# Patient Record
Sex: Female | Born: 1970
Health system: Southern US, Community
[De-identification: ages and names within clinical notes are randomized; demographics above are authoritative.]

## PROBLEM LIST (undated history)

## (undated) DIAGNOSIS — I10 Essential (primary) hypertension: Secondary | ICD-10-CM

## (undated) DIAGNOSIS — F32A Depression, unspecified: Secondary | ICD-10-CM

## (undated) DIAGNOSIS — K219 Gastro-esophageal reflux disease without esophagitis: Secondary | ICD-10-CM

## (undated) DIAGNOSIS — E119 Type 2 diabetes mellitus without complications: Secondary | ICD-10-CM

## (undated) DIAGNOSIS — F329 Major depressive disorder, single episode, unspecified: Secondary | ICD-10-CM

## (undated) HISTORY — DX: Porphyria cutanea tarda: E80.1

## (undated) HISTORY — PX: OOPHORECTOMY: SHX86

## (undated) HISTORY — PX: TOTAL ABDOMINAL HYSTERECTOMY: SHX209

## (undated) HISTORY — DX: Gastro-esophageal reflux disease without esophagitis: K21.9

## (undated) HISTORY — PX: ABDOMINAL HYSTERECTOMY: SHX81

---

## 2007-02-17 ENCOUNTER — Emergency Department: Payer: Self-pay | Admitting: Emergency Medicine

## 2010-10-26 ENCOUNTER — Ambulatory Visit: Payer: Self-pay

## 2012-04-26 ENCOUNTER — Ambulatory Visit (INDEPENDENT_AMBULATORY_CARE_PROVIDER_SITE_OTHER): Payer: Self-pay | Admitting: Physician Assistant

## 2012-04-26 VITALS — BP 146/90 | HR 66 | Temp 98.8°F | Resp 16 | Ht 62.0 in | Wt 158.0 lb

## 2012-04-26 DIAGNOSIS — Z111 Encounter for screening for respiratory tuberculosis: Secondary | ICD-10-CM

## 2012-04-26 NOTE — Progress Notes (Signed)
  Subjective:    Patient ID: Helen Martinez, female    DOB: [birth year], 41 y.o.   MRN: [id number]  HPI 41 year old female presents for TB test. 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 She has had TB test's in the past and never had a positive test.      Review of Systems  All other systems reviewed and are negative.       Objective:   Physical Exam  Constitutional: She is oriented to person, place, and time. She appears well-developed and well-nourished.  HENT:  Head: Normocephalic and atraumatic.  Right Ear: External ear normal.  Left Ear: External ear normal.  Eyes: Conjunctivae are normal.  Neck: Normal range of motion.  Cardiovascular: Normal rate, regular rhythm and normal heart sounds.   Pulmonary/Chest: Effort normal and breath sounds normal.  Neurological: She is alert and oriented to person, place, and time.  Psychiatric: She has a normal mood and affect. Her behavior is normal. Judgment and thought content normal.          Assessment & Plan:   1. Screening for tuberculosis  TB Skin Test  Return in 48-72 hours for TB read

## 2012-04-28 ENCOUNTER — Encounter (INDEPENDENT_AMBULATORY_CARE_PROVIDER_SITE_OTHER): Payer: Self-pay

## 2012-04-28 DIAGNOSIS — Z111 Encounter for screening for respiratory tuberculosis: Secondary | ICD-10-CM

## 2012-04-28 LAB — TB SKIN TEST
Induration: 0 mm
TB Skin Test: NEGATIVE

## 2013-09-16 ENCOUNTER — Emergency Department (HOSPITAL_COMMUNITY): Payer: Self-pay

## 2013-09-16 ENCOUNTER — Emergency Department (HOSPITAL_COMMUNITY)
Admission: EM | Admit: 2013-09-16 | Discharge: 2013-09-16 | Disposition: A | Discharge status: Home / Self Care | Payer: Self-pay | Attending: Emergency Medicine | Admitting: Emergency Medicine

## 2013-09-16 ENCOUNTER — Encounter (HOSPITAL_COMMUNITY): Payer: Self-pay | Admitting: Emergency Medicine

## 2013-09-16 DIAGNOSIS — Y939 Activity, unspecified: Secondary | ICD-10-CM | POA: Insufficient documentation

## 2013-09-16 DIAGNOSIS — W010XXA Fall on same level from slipping, tripping and stumbling without subsequent striking against object, initial encounter: Secondary | ICD-10-CM | POA: Insufficient documentation

## 2013-09-16 DIAGNOSIS — F329 Major depressive disorder, single episode, unspecified: Secondary | ICD-10-CM | POA: Insufficient documentation

## 2013-09-16 DIAGNOSIS — F17200 Nicotine dependence, unspecified, uncomplicated: Secondary | ICD-10-CM | POA: Insufficient documentation

## 2013-09-16 DIAGNOSIS — F3289 Other specified depressive episodes: Secondary | ICD-10-CM | POA: Insufficient documentation

## 2013-09-16 DIAGNOSIS — R102 Pelvic and perineal pain: Secondary | ICD-10-CM

## 2013-09-16 DIAGNOSIS — M549 Dorsalgia, unspecified: Secondary | ICD-10-CM

## 2013-09-16 DIAGNOSIS — IMO0002 Reserved for concepts with insufficient information to code with codable children: Secondary | ICD-10-CM | POA: Insufficient documentation

## 2013-09-16 DIAGNOSIS — Z79899 Other long term (current) drug therapy: Secondary | ICD-10-CM | POA: Insufficient documentation

## 2013-09-16 DIAGNOSIS — Y929 Unspecified place or not applicable: Secondary | ICD-10-CM | POA: Insufficient documentation

## 2013-09-16 DIAGNOSIS — I10 Essential (primary) hypertension: Secondary | ICD-10-CM | POA: Insufficient documentation

## 2013-09-16 HISTORY — DX: Essential (primary) hypertension: I10

## 2013-09-16 HISTORY — DX: Major depressive disorder, single episode, unspecified: F32.9

## 2013-09-16 HISTORY — DX: Depression, unspecified: F32.A

## 2013-09-16 MED ORDER — ACETAMINOPHEN 325 MG PO TABS
650.0000 mg | ORAL_TABLET | Freq: Once | ORAL | Status: AC
  Administered 2013-09-16: 650 mg via ORAL
  Filled 2013-09-16: qty 2

## 2013-09-16 MED ORDER — OXYCODONE-ACETAMINOPHEN 5-325 MG PO TABS: 1.0000 | ORAL_TABLET | ORAL | Status: DC | PRN

## 2013-09-16 MED ORDER — IBUPROFEN 600 MG PO TABS: 600.0000 mg | ORAL_TABLET | Freq: Three times a day (TID) | ORAL | Status: DC | PRN

## 2013-09-16 NOTE — ED Notes (Signed)
Patient reports that she slipped on her back porch steps and slid flat on her back. Patient c/o right lower back pain that has not gotten any better. MAE. Patient denies any numbness or tingling of all extremities.

## 2013-09-16 NOTE — ED Notes (Signed)
Patient transported to X-ray 

## 2013-09-16 NOTE — ED Provider Notes (Signed)
CSN: [id number]     Arrival date & time 09/16/13  0813 History   First MD Initiated Contact with Patient 09/16/13 0820     Chief Complaint  Patient presents with  . Back Pain  . Fall    HPI Patient reports slipping and falling on her bottom 3 days ago.  She slipped on a wet porch.  She denies weakness of her lower extremities.  She reports pain in her right SI joint as well as her right lumbar spine.  She does have a small bruise overlying this area.  She denies pain with range of motion of her right hip.  She tried ibuprofen without improvement in her pain.  Her pain is mild to moderate in severity worse with movement and palpation.   Past Medical History  Diagnosis Date  . Hypertension   . Depression    Past Surgical History  Procedure Laterality Date  . Abdominal hysterectomy     Family History  Problem Relation Age of Onset  . Hypertension Mother   . Cancer Father    History  Substance Use Topics  . Smoking status: Current Every Day Smoker -- 1.00 packs/day    Types: Cigarettes  . Smokeless tobacco: Never Used  . Alcohol Use: Yes     Comment: wine 2-3 times a week   OB History   Grav Para Term Preterm Abortions TAB SAB Ect Mult Living                 Review of Systems  All other systems reviewed and are negative.    Allergies  Review of patient's allergies indicates no known allergies.  Home Medications   Current Outpatient Rx  Name  Route  Sig  Dispense  Refill  . ibuprofen (ADVIL,MOTRIN) 200 MG tablet   Oral   Take 800 mg by mouth every 6 (six) hours as needed for mild pain or moderate pain.         Marland Kitchen. venlafaxine XR (EFFEXOR-XR) 150 MG 24 hr capsule   Oral   Take 150 mg by mouth daily with breakfast.         . ibuprofen (ADVIL,MOTRIN) 600 MG tablet   Oral   Take 1 tablet (600 mg total) by mouth every 8 (eight) hours as needed.   15 tablet   0   . oxyCODONE-acetaminophen (PERCOCET/ROXICET) 5-325 MG per tablet   Oral   Take 1 tablet by mouth  every 4 (four) hours as needed for severe pain.   20 tablet   0    BP 160/88  Pulse 66  Temp(Src) 97.9 F (36.6 C) (Oral)  Resp 18  Ht 5' 2.5" (1.588 m)  Wt 162 lb (73.483 kg)  BMI 29.14 kg/m2  SpO2 98% Physical Exam  Nursing note and vitals reviewed. Constitutional: She is oriented to person, place, and time. She appears well-developed and well-nourished. No distress.  HENT:  Head: Normocephalic and atraumatic.  Eyes: EOM are normal.  Neck: Normal range of motion.  Cardiovascular: Normal rate, regular rhythm and normal heart sounds.   Pulmonary/Chest: Effort normal and breath sounds normal.  Abdominal: Soft. She exhibits no distension. There is no tenderness.  Musculoskeletal: Normal range of motion.  Mild tenderness of her lumbar spine without lumbar step-off.  Mild tenderness of her right SI joint without significant deformity.  Small amount of bruising noted of her low back.  5 out of 5 strength in bilateral lower extremity major muscle groups  Neurological: She is alert  and oriented to person, place, and time.  Skin: Skin is warm and dry.  Psychiatric: She has a normal mood and affect. Judgment normal.    ED Course  Procedures (including critical care time) Labs Review Labs Reviewed - No data to display Imaging Review Dg Lumbar Spine Complete  09/16/2013   CLINICAL DATA:  Pain status post fall.  EXAM: LUMBAR SPINE - COMPLETE 4+ VIEW  COMPARISON:  AP pelvis dated September 16, 2013.  FINDINGS: The lumbar vertebral bodies are preserved in height. The intervertebral disc space heights are reasonably well maintained. There is no pars defect nor spondylolisthesis. The pedicles and transverse processes appear intact where visualized. The observed portions of the sacrum appear normal.  IMPRESSION: There is no acute bony abnormality or significant degenerative change of the lumbar spine.   Electronically Signed   By: David  [place]   On: 09/16/2013 09:01   Dg Pelvis 1-2  Views  09/16/2013   CLINICAL DATA:  Patient fell.  Right posterior ilium pain.  EXAM: PELVIS - 1-2 VIEW  COMPARISON:  None.  FINDINGS: There is no evidence of pelvic fracture or diastasis. No other pelvic bone lesions are seen.  IMPRESSION: Negative.   Electronically Signed   By: Elige Ko   On: 09/16/2013 08:59    EKG Interpretation   None       MDM   1. Pelvic pain   2. Back pain    Likely musculoskeletal pain.  Normal neurologic function.  X-rays without acute abnormality.  Home with a short course pain medicine    Lyanne Co, MD 09/16/13 [phone]

## 2013-09-18 ENCOUNTER — Encounter: Payer: Self-pay | Admitting: Family Medicine

## 2013-09-18 ENCOUNTER — Ambulatory Visit (INDEPENDENT_AMBULATORY_CARE_PROVIDER_SITE_OTHER): Payer: Self-pay | Admitting: Family Medicine

## 2013-09-18 VITALS — BP 120/68 | HR 77 | Temp 98.1°F | Resp 16 | Wt 163.8 lb

## 2013-09-18 DIAGNOSIS — M549 Dorsalgia, unspecified: Secondary | ICD-10-CM

## 2013-09-18 DIAGNOSIS — S20229A Contusion of unspecified back wall of thorax, initial encounter: Secondary | ICD-10-CM

## 2013-09-18 DIAGNOSIS — S300XXA Contusion of lower back and pelvis, initial encounter: Secondary | ICD-10-CM | POA: Insufficient documentation

## 2013-09-18 MED ORDER — TRAMADOL HCL 50 MG PO TABS: 50.0000 mg | ORAL_TABLET | Freq: Every evening | ORAL | Status: DC | PRN

## 2013-09-18 MED ORDER — METHYLPREDNISOLONE ACETATE 80 MG/ML IJ SUSP
80.0000 mg | Freq: Once | INTRAMUSCULAR | Status: AC
  Administered 2013-09-18: 80 mg via INTRAMUSCULAR

## 2013-09-18 MED ORDER — MELOXICAM 15 MG PO TABS: 15.0000 mg | ORAL_TABLET | Freq: Every day | ORAL | Status: DC

## 2013-09-18 MED ORDER — KETOROLAC TROMETHAMINE 60 MG/2ML IM SOLN
60.0000 mg | Freq: Once | INTRAMUSCULAR | Status: AC
  Administered 2013-09-18: 60 mg via INTRAMUSCULAR

## 2013-09-18 NOTE — Progress Notes (Signed)
   CC: Back pain after fall  HPI: Patient is a very pleasant 43 year old female coming in with back pain after fall. This occurred 3 days ago. Patient fell slipping on ice on her porch falling directly on her back. Patient states over the course of time and seems to be worsening. Patient had a very difficult time at work today secondary to the amount of pain in the lower back mostly on the right side. Patient denies any radiation or any weakness in the lower extremities. Patient states that the pain is unrelenting. Patient originally went to the emergency department where x-rays were done. I did review these x-rays today did not show any bony abnormality or any acute fracture of the lumbar spine or pelvis. Patient has been taking hydrocodone that she states only makes her tired. Patient describes the pain as an unrelenting chronic constant pain that can be worse with movement. States that it does feel like a burning sensation when she moves her leg certain ways. Patient was the severity of 9/10   Past medical, surgical, family and social history reviewed. Medications reviewed all in the electronic medical record.   Review of Systems: No headache, visual changes, nausea, vomiting, diarrhea, constipation, dizziness, abdominal pain, skin rash, fevers, chills, night sweats, weight loss, swollen lymph nodes, body aches, joint swelling, muscle aches, chest pain, shortness of breath, mood changes.   Objective:    Blood pressure 120/68, pulse 77, temperature 98.1 F (36.7 C), temperature source Oral, resp. rate 16, weight 163 lb 12.8 oz (74.299 kg), SpO2 96.00%.   General: No apparent distress alert and oriented x3 mood and affect normal, dressed appropriately.  HEENT: Pupils equal, extraocular movements intact Respiratory: Patient's speak in full sentences and does not appear short of breath Cardiovascular: No lower extremity edema, non tender, no erythema Skin: Warm dry intact with no signs of  infection or rash on extremities or on axial skeleton. Abdomen: Soft nontender Neuro: Cranial nerves II through XII are intact, neurovascularly intact in all extremities with 2+ DTRs and 2+ pulses. Lymph: No lymphadenopathy of posterior or anterior cervical chain or axillae bilaterally.  Gait normal with good balance and coordination.  MSK: Non tender with full range of motion and good stability and symmetric strength and tone of shoulders, elbows, wrist, hip, knee and ankles bilaterally.  Back Exam:  Inspection: Patient does have bruising of the paraspinal musculature on the right side as well as midline. Patient though does not have spinous process tenderness to palpation. Motion: Flexion 25 deg, Extension 35 deg, Side Bending to 35 deg bilaterally,  Rotation to 25 deg bilaterally  SLR laying: Negative  XSLR laying: Positive  Palpable tenderness: As stated above mostly of the right-sided paraspinal musculature no spinous process tenderness. FABER: Unable to do secondary to pain. Sensory change: Gross sensation intact to all lumbar and sacral dermatomes.  Reflexes: 2+ at both patellar tendons, 2+ at achilles tendons, Babinski's downgoing.  Strength at foot  Plantar-flexion: 5/5 Dorsi-flexion: 5/5 Eversion: 5/5 Inversion: 5/5  Leg strength  Quad: 5/5 Hamstring: 5/5 Hip flexor: 5/5 Hip abductors: 5/5  Gait unremarkable.    Impression and Recommendations:     This case required medical decision making of moderate complexity.

## 2013-09-18 NOTE — Patient Instructions (Signed)
Good to meet you Two injections today they will help Ice 20 minutes 3 times a day No lifting at work until I see you Monday Meloxicam daily for 10 days then as needed.  Tramamdol at night Come back Monday.

## 2013-09-18 NOTE — Assessment & Plan Note (Addendum)
I do think patient does have a lower back contusion with a very large muscle contusion. Discuss patient about different treatment options. Patient was given an injection of Toradol as well as Depo-Medrol today. Patient will do meloxicam daily for 10 days and as needed. Discussed icing protocol and starting range of motion exercises in 48 hours. Would like patient to followup again in 5 days for further evaluation. Patient was given a note for work to limited to 20 pound lifting until I see her again on Monday. Patient is to have any radiation down her legs or any new symptoms she will call back immediately. There is a chance that this could be herniated disc am hoping that this only a contusion.

## 2013-09-23 ENCOUNTER — Encounter: Payer: Self-pay | Admitting: *Deleted

## 2013-09-23 ENCOUNTER — Ambulatory Visit (INDEPENDENT_AMBULATORY_CARE_PROVIDER_SITE_OTHER): Payer: Self-pay | Admitting: Family Medicine

## 2013-09-23 ENCOUNTER — Encounter: Payer: Self-pay | Admitting: Family Medicine

## 2013-09-23 VITALS — BP 132/72 | HR 98 | Temp 98.2°F | Resp 16 | Wt 164.8 lb

## 2013-09-23 DIAGNOSIS — S20229A Contusion of unspecified back wall of thorax, initial encounter: Secondary | ICD-10-CM

## 2013-09-23 DIAGNOSIS — S300XXA Contusion of lower back and pelvis, initial encounter: Secondary | ICD-10-CM

## 2013-09-23 NOTE — Progress Notes (Signed)
   CC: Back pain after fall follow up./   HPI: Patient is a very pleasant 43 year old female coming in with back pain after fall. Patient was seen previously and was diagnosed with a bad contusion. Patient was given anti-inflammatories, muscle relaxants, given home exercise program to start of the weekend. Patient states   Past medical, surgical, family and social history reviewed. Medications reviewed all in the electronic medical record.   Review of Systems: No headache, visual changes, nausea, vomiting, diarrhea, constipation, dizziness, abdominal pain, skin rash, fevers, chills, night sweats, weight loss, swollen lymph nodes, body aches, joint swelling, muscle aches, chest pain, shortness of breath, mood changes.   Objective:    There were no vitals taken for this visit.   General: No apparent distress alert and oriented x3 mood and affect normal, dressed appropriately.  HEENT: Pupils equal, extraocular movements intact Respiratory: Patient's speak in full sentences and does not appear short of breath Cardiovascular: No lower extremity edema, non tender, no erythema Skin: Warm dry intact with no signs of infection or rash on extremities or on axial skeleton. Abdomen: Soft nontender Neuro: Cranial nerves II through XII are intact, neurovascularly intact in all extremities with 2+ DTRs and 2+ pulses. Lymph: No lymphadenopathy of posterior or anterior cervical chain or axillae bilaterally.  Gait normal with good balance and coordination.  MSK: Non tender with full range of motion and good stability and symmetric strength and tone of shoulders, elbows, wrist, hip, knee and ankles bilaterally.  Back Exam:  Inspection: Patient's bruising has improved since previous visit. Patient though does not have spinous process tenderness to palpation. Motion: Flexion 35 deg, Extension 35 deg, Side Bending to 45 deg bilaterally,  Rotation to 35 deg bilaterally  SLR laying: Negative  XSLR laying:  Negative  Palpable tenderness: As stated above mostly of the right-sided paraspinal musculature no spinous process tenderness. FABER: Unable to do secondary to pain. Sensory change: Gross sensation intact to all lumbar and sacral dermatomes.  Reflexes: 2+ at both patellar tendons, 2+ at achilles tendons, Babinski's downgoing.  Strength at foot  Plantar-flexion: 5/5 Dorsi-flexion: 5/5 Eversion: 5/5 Inversion: 5/5  Leg strength  Quad: 5/5 Hamstring: 5/5 Hip flexor: 5/5 Hip abductors: 5/5  Gait unremarkable.    Impression and Recommendations:     This case required medical decision making of moderate complexity.

## 2013-09-23 NOTE — Assessment & Plan Note (Signed)
Patient is continuing to improve and I do not find any radicular symptoms not scared for herniated disc. Did not feel x-rays are necessary at this time. Patient's is able to lift all restrictions from work at this time. Encourage her to continue her exercises as well as ibuprofen for the next 5 days. Patient should follow up in 2 weeks we'll more time for further evaluation or patient can call to make sure she continues to do well.

## 2013-09-23 NOTE — Progress Notes (Signed)
Pre-visit discussion using our clinic review tool. No additional management support is needed unless otherwise documented below in the visit note.  

## 2013-09-23 NOTE — Patient Instructions (Signed)
Good to see you Exercises 3 times a week Ibuprofen for next 5 days scheduled.  No restriction at work.  See you again in 2 weeks to make sure you are completely fine.

## 2014-12-16 ENCOUNTER — Ambulatory Visit: Admit: 2014-12-16 | Payer: Self-pay | Admitting: Oncology

## 2015-01-01 ENCOUNTER — Ambulatory Visit
Admit: 2015-01-01 | Disposition: A | Discharge status: Home / Self Care | Payer: Self-pay | Attending: Family Medicine | Admitting: Family Medicine

## 2015-01-27 ENCOUNTER — Other Ambulatory Visit: Payer: Self-pay | Admitting: Internal Medicine

## 2015-01-27 DIAGNOSIS — N63 Unspecified lump in unspecified breast: Secondary | ICD-10-CM

## 2015-01-27 DIAGNOSIS — R928 Other abnormal and inconclusive findings on diagnostic imaging of breast: Secondary | ICD-10-CM

## 2015-02-04 ENCOUNTER — Ambulatory Visit
Admission: RE | Admit: 2015-02-04 | Discharge: 2015-02-04 | Disposition: A | Discharge status: Home / Self Care | Payer: 59 | Source: Ambulatory Visit | Attending: Internal Medicine | Admitting: Internal Medicine

## 2015-02-04 DIAGNOSIS — R928 Other abnormal and inconclusive findings on diagnostic imaging of breast: Secondary | ICD-10-CM

## 2015-02-04 DIAGNOSIS — R922 Inconclusive mammogram: Secondary | ICD-10-CM | POA: Diagnosis not present

## 2015-02-04 DIAGNOSIS — N63 Unspecified lump in unspecified breast: Secondary | ICD-10-CM

## 2015-09-23 DIAGNOSIS — J40 Bronchitis, not specified as acute or chronic: Secondary | ICD-10-CM | POA: Diagnosis not present

## 2015-09-23 DIAGNOSIS — I10 Essential (primary) hypertension: Secondary | ICD-10-CM | POA: Insufficient documentation

## 2015-09-23 DIAGNOSIS — J039 Acute tonsillitis, unspecified: Secondary | ICD-10-CM | POA: Diagnosis not present

## 2015-09-23 DIAGNOSIS — E1159 Type 2 diabetes mellitus with other circulatory complications: Secondary | ICD-10-CM | POA: Insufficient documentation

## 2015-09-24 MED FILL — VENTOLIN HFA 90 MCG INHALER: 108 (90 BAS | 17 days supply | Qty: 18 | Fill #0

## 2015-09-24 MED FILL — AMOX TR-K CLV 875-125 MG TA: 875-125 | 10 days supply | Qty: 20 | Fill #0

## 2015-09-24 MED FILL — PROMETHAZINE-DM SYRUP: 6.25-15 | 7 days supply | Qty: 120 | Fill #0

## 2015-09-24 MED FILL — predniSONE 20 MG TABS: 20 | 6 days supply | Qty: 18 | Fill #0

## 2015-10-06 DIAGNOSIS — I10 Essential (primary) hypertension: Secondary | ICD-10-CM | POA: Diagnosis not present

## 2015-10-06 DIAGNOSIS — R232 Flushing: Secondary | ICD-10-CM | POA: Diagnosis not present

## 2015-11-16 MED FILL — VENLAFAXINE HCL ER 150 MG C: 150 | 90 days supply | Qty: 90 | Fill #0

## 2015-11-17 MED FILL — HYDROCHLOROTHIAZIDE 25 MG T: 25 | 30 days supply | Qty: 30 | Fill #0

## 2015-11-17 MED FILL — LOSARTAN POTASSIUM 50 MG TA: 50 | 30 days supply | Qty: 30 | Fill #0

## 2015-12-15 DIAGNOSIS — R778 Other specified abnormalities of plasma proteins: Secondary | ICD-10-CM | POA: Diagnosis not present

## 2015-12-15 DIAGNOSIS — I10 Essential (primary) hypertension: Secondary | ICD-10-CM | POA: Diagnosis not present

## 2015-12-21 MED FILL — HYDROCHLOROTHIAZIDE 25 MG T: 25 | 30 days supply | Qty: 30 | Fill #0

## 2015-12-21 MED FILL — LOSARTAN POTASSIUM 50 MG TA: 50 | 30 days supply | Qty: 30 | Fill #1

## 2015-12-23 MED FILL — AMLODIPINE BESYLATE 5 MG TA: 5 | 90 days supply | Qty: 90 | Fill #0

## 2016-01-20 ENCOUNTER — Other Ambulatory Visit: Payer: Self-pay | Admitting: Family Medicine

## 2016-01-20 DIAGNOSIS — Z1231 Encounter for screening mammogram for malignant neoplasm of breast: Secondary | ICD-10-CM

## 2016-01-22 MED FILL — HYDROCHLOROTHIAZIDE 25 MG T: 25 | 30 days supply | Qty: 30 | Fill #1

## 2016-01-22 MED FILL — LOSARTAN POTASSIUM 50 MG TA: 50 | 30 days supply | Qty: 30 | Fill #2

## 2016-02-09 ENCOUNTER — Other Ambulatory Visit: Payer: Self-pay | Admitting: Family Medicine

## 2016-02-09 ENCOUNTER — Ambulatory Visit
Admission: RE | Admit: 2016-02-09 | Discharge: 2016-02-09 | Disposition: A | Discharge status: Home / Self Care | Payer: 59 | Source: Ambulatory Visit | Attending: Family Medicine | Admitting: Family Medicine

## 2016-02-09 DIAGNOSIS — Z1231 Encounter for screening mammogram for malignant neoplasm of breast: Secondary | ICD-10-CM | POA: Diagnosis not present

## 2016-02-10 ENCOUNTER — Ambulatory Visit: Payer: 59

## 2016-02-18 MED FILL — HYDROCHLOROTHIAZIDE 25 MG T: 25 | 30 days supply | Qty: 30 | Fill #2

## 2016-02-22 MED FILL — VENLAFAXINE HCL ER 150 MG C: 150 | 90 days supply | Qty: 90 | Fill #0

## 2016-02-22 MED FILL — LOSARTAN POTASSIUM 50 MG TA: 50 | 90 days supply | Qty: 90 | Fill #0

## 2016-03-16 MED FILL — AMLODIPINE BESYLATE 5 MG TA: 5 | 30 days supply | Qty: 30 | Fill #0

## 2016-04-20 MED FILL — AMLODIPINE BESYLATE 5 MG TA: 5 | 30 days supply | Qty: 30 | Fill #1

## 2016-05-13 MED FILL — VENLAFAXINE HCL ER 150 MG C: 150 | 90 days supply | Qty: 90 | Fill #1

## 2016-05-13 MED FILL — AMLODIPINE BESYLATE 5 MG TA: 5 | 30 days supply | Qty: 30 | Fill #2

## 2016-05-16 MED FILL — LOSARTAN POTASSIUM 50 MG TA: 50 | 90 days supply | Qty: 90 | Fill #0

## 2016-05-18 ENCOUNTER — Encounter (INDEPENDENT_AMBULATORY_CARE_PROVIDER_SITE_OTHER): Payer: Self-pay

## 2016-05-18 ENCOUNTER — Ambulatory Visit (INDEPENDENT_AMBULATORY_CARE_PROVIDER_SITE_OTHER): Payer: 59 | Admitting: Gastroenterology

## 2016-05-18 ENCOUNTER — Encounter: Payer: Self-pay | Admitting: Gastroenterology

## 2016-05-18 VITALS — BP 120/76 | HR 88 | Ht 62.5 in | Wt 171.5 lb

## 2016-05-18 DIAGNOSIS — R195 Other fecal abnormalities: Secondary | ICD-10-CM

## 2016-05-18 DIAGNOSIS — K6289 Other specified diseases of anus and rectum: Secondary | ICD-10-CM

## 2016-05-18 DIAGNOSIS — K602 Anal fissure, unspecified: Secondary | ICD-10-CM

## 2016-05-18 MED ORDER — DILTIAZEM GEL 2 %: 1.0000 "application " | Freq: Three times a day (TID) | CUTANEOUS | 1 refills | Status: DC

## 2016-05-18 MED FILL — HYDROCHLOROTHIAZIDE 25 MG T: 25 | 90 days supply | Qty: 90 | Fill #0

## 2016-05-18 NOTE — Patient Instructions (Signed)
We sent a [facility] for the Diltiazem gel 2 %.  We have given you samples of Metmucil fiber powder.   Call us back with an update in 2-3 weeks. You can ask for Kyro Joswick.  We have given you rectal care instructions.

## 2016-06-02 ENCOUNTER — Encounter: Payer: Self-pay | Admitting: Gastroenterology

## 2016-06-02 DIAGNOSIS — K6289 Other specified diseases of anus and rectum: Secondary | ICD-10-CM | POA: Insufficient documentation

## 2016-06-02 DIAGNOSIS — R195 Other fecal abnormalities: Secondary | ICD-10-CM | POA: Insufficient documentation

## 2016-06-02 DIAGNOSIS — K602 Anal fissure, unspecified: Secondary | ICD-10-CM | POA: Insufficient documentation

## 2016-06-02 NOTE — Progress Notes (Signed)
05/18/2016 Helen Martinez [id number] 4/09/811903/11/1970   HISTORY OF PRESENT ILLNESS:  This is a 45 year old female who is new to our practice.  She presents to our office today for complaints of rectal pain and bleeding recently that she thinks is secondary to hemorrhoids.  Says that it hurts to have a BM, etc on and off for the past month.  Sees small amounts of BRB on the TP at times recently as well.  Has tried generic Preparation H OTC without relief.  Says that she tends to have loose stools for years and always thought she had IBS.  Has had some diarrhea recently and contributed that to the IBS as well.  Also mentions that she has a hard time keeping herself clean and that she sometimes notices seepage of stool contents.  Past Medical History:  Diagnosis Date  . Depression   . GERD (gastroesophageal reflux disease)   . Hypertension    Past Surgical History:  Procedure Laterality Date  . ABDOMINAL HYSTERECTOMY      reports that she has been smoking Cigarettes.  She has been smoking about 1.00 pack per day. She has never used smokeless tobacco. She reports that she drinks alcohol. She reports that she does not use drugs. family history includes Breast cancer in her maternal aunt; Cancer in her father; Hypertension in her mother. No Known Allergies    Outpatient Encounter Prescriptions as of 05/18/2016  Medication Sig  . amLODipine (NORVASC) 10 MG tablet Take 10 mg by mouth daily.  . hydrochlorothiazide (HYDRODIURIL) 25 MG tablet Take 1 tablet by mouth daily.  Marland Kitchen. ibuprofen (ADVIL,MOTRIN) 200 MG tablet Take 800 mg by mouth every 6 (six) hours as needed for mild pain or moderate pain.  Marland Kitchen. losartan (COZAAR) 50 MG tablet Take 1 tablet by mouth daily.  Marland Kitchen. omeprazole (PRILOSEC OTC) 20 MG tablet Take 20 mg by mouth daily.  Marland Kitchen. venlafaxine XR (EFFEXOR-XR) 150 MG 24 hr capsule Take 150 mg by mouth daily with breakfast.  . diltiazem 2 % GEL Apply 1 application topically 3 (three) times daily.  .  [DISCONTINUED] ibuprofen (ADVIL,MOTRIN) 600 MG tablet Take 1 tablet (600 mg total) by mouth every 8 (eight) hours as needed.  . [DISCONTINUED] meloxicam (MOBIC) 15 MG tablet Take 1 tablet (15 mg total) by mouth daily.  . [DISCONTINUED] oxyCODONE-acetaminophen (PERCOCET/ROXICET) 5-325 MG per tablet Take 1 tablet by mouth every 4 (four) hours as needed for severe pain.  . [DISCONTINUED] traMADol (ULTRAM) 50 MG tablet Take 1 tablet (50 mg total) by mouth at bedtime as needed.   No facility-administered encounter medications on file as of 05/18/2016.     REVIEW OF SYSTEMS  : All other systems reviewed and negative except where noted in the History of Present Illness.   PHYSICAL EXAM: BP 120/76 (BP Location: Right Arm, Patient Position: Sitting, Cuff Size: Normal)   Pulse 88   Ht 5' 2.5" (1.588 m) Comment: height measured without shoes  Wt 171 lb 8 oz (77.8 kg)   BMI 30.87 kg/m  General: Well developed white female in no acute distress Head: Normocephalic and atraumatic Eyes:  Sclerae anicteric, conjunctiva pink. Ears: Normal auditory acuity. Lungs: Clear throughout to auscultation Heart: Regular rate and rhythm Abdomen: Soft, non-distended.  Normal bowel sounds.  Non-tender. Rectal:  External hemorrhoids noted and appears to have an anal fissure posteriorly.  No definite masses or abnormalities felt on DRE.  Stool was hemoccult negative on exam. Musculoskeletal: Symmetrical with no gross  deformities  Skin: No lesions on visible extremities Extremities: No edema  Neurological: Alert oriented x 4, grossly non-focal Psychological:  Alert and cooperative. Normal mood and affect  ASSESSMENT AND PLAN: -Anal fissure, suspected cause of recent rectal pain and bleeding:  Will treat with diltiazem gel for 6-8 weeks.  We also discussed rectal care instructions and paperwork was given. -Loose stools:  Intermittent on and off for years.  We discussed stool studies but she declined for now.  Always  assumed that she had IBS.  Will start by taking a daily fiber supplement to see if this adds bulk to her stool and helps with the leakage that she experiences on occasion.  *She will call back in 2-3 weeks with an update of her symptoms.   CC:  No ref. provider found

## 2016-06-08 NOTE — Progress Notes (Signed)
Agree with Ms. Helen Martinez's management. If she has not called back soon have staff contact her to make sure symptoms and signs are improving and if not let me know. Iva Booparl E. Gessner, MD, Clementeen GrahamFACG

## 2016-06-23 ENCOUNTER — Ambulatory Visit (HOSPITAL_COMMUNITY)
Admission: EM | Admit: 2016-06-23 | Discharge: 2016-06-23 | Disposition: A | Discharge status: Home / Self Care | Payer: 59 | Attending: Family Medicine | Admitting: Family Medicine

## 2016-06-23 ENCOUNTER — Ambulatory Visit (INDEPENDENT_AMBULATORY_CARE_PROVIDER_SITE_OTHER): Payer: 59

## 2016-06-23 ENCOUNTER — Encounter (HOSPITAL_COMMUNITY): Payer: Self-pay | Admitting: Emergency Medicine

## 2016-06-23 DIAGNOSIS — J209 Acute bronchitis, unspecified: Secondary | ICD-10-CM | POA: Diagnosis not present

## 2016-06-23 DIAGNOSIS — J029 Acute pharyngitis, unspecified: Secondary | ICD-10-CM | POA: Diagnosis not present

## 2016-06-23 DIAGNOSIS — F17200 Nicotine dependence, unspecified, uncomplicated: Secondary | ICD-10-CM

## 2016-06-23 DIAGNOSIS — J040 Acute laryngitis: Secondary | ICD-10-CM | POA: Diagnosis not present

## 2016-06-23 MED ORDER — HYDROCOD POLST-CPM POLST ER 10-8 MG/5ML PO SUER: 5.0000 mL | Freq: Two times a day (BID) | ORAL | 0 refills | Status: DC | PRN

## 2016-06-23 MED ORDER — DOXYCYCLINE HYCLATE 100 MG PO CAPS: 100.0000 mg | ORAL_CAPSULE | Freq: Two times a day (BID) | ORAL | 0 refills | Status: DC

## 2016-06-23 NOTE — Discharge Instructions (Signed)
Take all of medicine,  mucinex and drink lots of fluids, no more smoking, see your doctor if further problems

## 2016-06-23 NOTE — ED Triage Notes (Signed)
Patient has a one week history of sore throat intermittently, headache, cough, productive cough and sinus drainage in back of throat

## 2016-06-23 NOTE — ED Provider Notes (Signed)
[facility]    CSN: [id number] Arrival date & time: 06/23/16  1537     History   Chief Complaint Chief Complaint  Patient presents with  . Cough  . URI    HPI Helen Martinez is a 45 y.o. female.   The history is provided by the patient.  Cough  Cough characteristics:  Productive Sputum characteristics:  Yellow Severity:  Mild Onset quality:  Gradual Duration:  1 week Chronicity:  New Smoker: yes   Context: smoke exposure   Relieved by:  None tried Worsened by:  Nothing Ineffective treatments:  None tried Associated symptoms: fever, rhinorrhea and sore throat   Associated symptoms: no shortness of breath and no wheezing   URI  Presenting symptoms: congestion, cough, fever, rhinorrhea and sore throat   Associated symptoms: no wheezing     Past Medical History:  Diagnosis Date  . Depression   . GERD (gastroesophageal reflux disease)   . Hypertension     Patient Active Problem List   Diagnosis Date Noted  . Anal fissure 06/02/2016  . Rectal pain 06/02/2016  . Loose stools 06/02/2016  . Contusion of lower back 09/18/2013    Past Surgical History:  Procedure Laterality Date  . ABDOMINAL HYSTERECTOMY      OB History    No data available       Home Medications    Prior to Admission medications   Medication Sig Start Date End Date Taking? Authorizing Provider  amLODipine (NORVASC) 10 MG tablet Take 10 mg by mouth daily.    Historical Provider, MD  diltiazem 2 % GEL Apply 1 application topically 3 (three) times daily. 05/18/16   Jessica D Zehr, PA-C  hydrochlorothiazide (HYDRODIURIL) 25 MG tablet Take 1 tablet by mouth daily. 03/23/16   Historical Provider, MD  ibuprofen (ADVIL,MOTRIN) 200 MG tablet Take 800 mg by mouth every 6 (six) hours as needed for mild pain or moderate pain.    Historical Provider, MD  losartan (COZAAR) 50 MG tablet Take 1 tablet by mouth daily. 02/22/16   Historical Provider, MD  omeprazole (PRILOSEC OTC) 20 MG tablet  Take 20 mg by mouth daily.    Historical Provider, MD  venlafaxine XR (EFFEXOR-XR) 150 MG 24 hr capsule Take 150 mg by mouth daily with breakfast.    Historical Provider, MD    Family History Family History  Problem Relation Age of Onset  . Hypertension Mother   . Cancer Father     mouth then mets  . Breast cancer Maternal Aunt     Social History Social History  Substance Use Topics  . Smoking status: Current Every Day Smoker    Packs/day: 1.00    Types: Cigarettes  . Smokeless tobacco: Never Used  . Alcohol use Yes     Comment: wine 2-3 times a week     Allergies   Review of patient's allergies indicates no known allergies.   Review of Systems Review of Systems  Constitutional: Positive for fever.  HENT: Positive for congestion, postnasal drip, rhinorrhea and sore throat.   Respiratory: Positive for cough. Negative for shortness of breath and wheezing.   Cardiovascular: Negative.   Gastrointestinal: Negative.   All other systems reviewed and are negative.    Physical Exam Triage Vital Signs ED Triage Vitals  Enc Vitals Group     BP 06/23/16 1611 148/91     Pulse Rate 06/23/16 1611 77     Resp 06/23/16 1611 16     Temp 06/23/16  1611 98 F (36.7 C)     Temp Source 06/23/16 1611 Tympanic     SpO2 06/23/16 1611 98 %     Weight --      Height --      Head Circumference --      Peak Flow --      Pain Score 06/23/16 1613 4     Pain Loc --      Pain Edu? --      Excl. in GC? --    No data found.   Updated Vital Signs BP 148/91 (BP Location: Left Arm)   Pulse 77   Temp 98 F (36.7 C) (Tympanic)   Resp 16   SpO2 98%   Visual Acuity Right Eye Distance:   Left Eye Distance:   Bilateral Distance:    Right Eye Near:   Left Eye Near:    Bilateral Near:     Physical Exam  Constitutional: She is oriented to person, place, and time. She appears well-developed and well-nourished.  HENT:  Right Ear: External ear normal.  Left Ear: External ear normal.    Mouth/Throat: Oropharynx is clear and moist. No oropharyngeal exudate.  Cardiovascular: Normal rate, regular rhythm, normal heart sounds and intact distal pulses.   Pulmonary/Chest: Effort normal. She has wheezes. She has rhonchi.  Lymphadenopathy:    She has no cervical adenopathy.  Neurological: She is alert and oriented to person, place, and time.  Skin: Skin is warm and dry.  Nursing note and vitals reviewed.    UC Treatments / Results  Labs (all labs ordered are listed, but only abnormal results are displayed) Labs Reviewed - No data to display  EKG  EKG Interpretation None       Radiology No results found. X-rays reviewed and report per radiologist.  Procedures Procedures (including critical care time)  Medications Ordered in UC Medications - No data to display   Initial Impression / Assessment and Plan / UC Course  I have reviewed the triage vital signs and the nursing notes.  Pertinent labs & imaging results that were available during my care of the patient were reviewed by me and considered in my medical decision making (see chart for details).  Clinical Course      Final Clinical Impressions(s) / UC Diagnoses   Final diagnoses:  None    New Prescriptions New Prescriptions   No medications on file     Linna Hoff, MD 06/23/16 1717

## 2016-07-20 MED FILL — AMLODIPINE BESYLATE 5 MG TA: 5 | 90 days supply | Qty: 90 | Fill #0

## 2016-08-16 MED FILL — VENLAFAXINE HCL ER 150 MG C: 150 | 90 days supply | Qty: 90 | Fill #2

## 2016-08-16 MED FILL — HYDROCHLOROTHIAZIDE 25 MG T: 25 | 30 days supply | Qty: 30 | Fill #0

## 2016-08-16 MED FILL — LOSARTAN POTASSIUM 50 MG TA: 50 | 30 days supply | Qty: 30 | Fill #0

## 2016-09-16 MED FILL — HYDROCHLOROTHIAZIDE 25 MG T: 25 | 30 days supply | Qty: 30 | Fill #1

## 2016-09-16 MED FILL — LOSARTAN POTASSIUM 50 MG TA: 50 | 30 days supply | Qty: 30 | Fill #1

## 2016-09-20 MED FILL — PROMETHAZINE-DM SYRUP: 6.25-15 | 12 days supply | Qty: 250 | Fill #0

## 2016-09-20 MED FILL — predniSONE 10 MG TABS: 10 | 6 days supply | Qty: 14 | Fill #0

## 2016-09-20 MED FILL — CEFDINIR 300 MG CAPSULE: 300 | 7 days supply | Qty: 14 | Fill #0

## 2016-10-13 MED FILL — AMLODIPINE BESYLATE 5 MG TA: 5 | 30 days supply | Qty: 30 | Fill #0

## 2016-10-13 MED FILL — HYDROCHLOROTHIAZIDE 25 MG T: 25 | 30 days supply | Qty: 30 | Fill #0

## 2016-10-13 MED FILL — LOSARTAN POTASSIUM 50 MG TA: 50 | 30 days supply | Qty: 30 | Fill #2

## 2016-10-25 DIAGNOSIS — Z1389 Encounter for screening for other disorder: Secondary | ICD-10-CM | POA: Diagnosis not present

## 2016-10-25 DIAGNOSIS — R740 Nonspecific elevation of levels of transaminase and lactic acid dehydrogenase [LDH]: Secondary | ICD-10-CM | POA: Diagnosis not present

## 2016-10-25 DIAGNOSIS — I10 Essential (primary) hypertension: Secondary | ICD-10-CM | POA: Diagnosis not present

## 2016-11-10 ENCOUNTER — Encounter: Payer: Self-pay | Admitting: Oncology

## 2016-11-10 ENCOUNTER — Other Ambulatory Visit: Payer: Self-pay | Admitting: *Deleted

## 2016-11-10 ENCOUNTER — Inpatient Hospital Stay: Payer: 59

## 2016-11-10 ENCOUNTER — Encounter (INDEPENDENT_AMBULATORY_CARE_PROVIDER_SITE_OTHER): Payer: Self-pay

## 2016-11-10 ENCOUNTER — Inpatient Hospital Stay: Payer: 59 | Attending: Oncology | Admitting: Oncology

## 2016-11-10 VITALS — BP 139/99 | HR 101 | Temp 97.1°F | Resp 18 | Ht 62.99 in | Wt 176.4 lb

## 2016-11-10 DIAGNOSIS — F17210 Nicotine dependence, cigarettes, uncomplicated: Secondary | ICD-10-CM | POA: Diagnosis not present

## 2016-11-10 DIAGNOSIS — R7989 Other specified abnormal findings of blood chemistry: Secondary | ICD-10-CM

## 2016-11-10 DIAGNOSIS — Z79899 Other long term (current) drug therapy: Secondary | ICD-10-CM | POA: Diagnosis not present

## 2016-11-10 DIAGNOSIS — K219 Gastro-esophageal reflux disease without esophagitis: Secondary | ICD-10-CM | POA: Diagnosis not present

## 2016-11-10 DIAGNOSIS — D751 Secondary polycythemia: Secondary | ICD-10-CM

## 2016-11-10 DIAGNOSIS — Z8639 Personal history of other endocrine, nutritional and metabolic disease: Secondary | ICD-10-CM

## 2016-11-10 DIAGNOSIS — F329 Major depressive disorder, single episode, unspecified: Secondary | ICD-10-CM | POA: Insufficient documentation

## 2016-11-10 DIAGNOSIS — I10 Essential (primary) hypertension: Secondary | ICD-10-CM | POA: Diagnosis not present

## 2016-11-10 LAB — CBC WITH DIFFERENTIAL/PLATELET
BASOS ABS: 0.1 10*3/uL (ref 0–0.1)
BASOS PCT: 1 %
EOS ABS: 0.1 10*3/uL (ref 0–0.7)
Eosinophils Relative: 1 %
HEMATOCRIT: 42.5 % (ref 35.0–47.0)
HEMOGLOBIN: 14.7 g/dL (ref 12.0–16.0)
Lymphocytes Relative: 30 %
Lymphs Abs: 2.3 10*3/uL (ref 1.0–3.6)
MCH: 33 pg (ref 26.0–34.0)
MCHC: 34.7 g/dL (ref 32.0–36.0)
MCV: 95 fL (ref 80.0–100.0)
MONO ABS: 0.7 10*3/uL (ref 0.2–0.9)
Monocytes Relative: 8 %
NEUTROS PCT: 60 %
Neutro Abs: 4.7 10*3/uL (ref 1.4–6.5)
Platelets: 194 10*3/uL (ref 150–440)
RBC: 4.47 MIL/uL (ref 3.80–5.20)
RDW: 13.1 % (ref 11.5–14.5)
WBC: 7.9 10*3/uL (ref 3.6–11.0)

## 2016-11-10 LAB — COMPREHENSIVE METABOLIC PANEL
ALBUMIN: 4 g/dL (ref 3.5–5.0)
ALT: 41 U/L (ref 14–54)
ANION GAP: 10 (ref 5–15)
AST: 44 U/L — AB (ref 15–41)
Alkaline Phosphatase: 39 U/L (ref 38–126)
BILIRUBIN TOTAL: 0.7 mg/dL (ref 0.3–1.2)
BUN: 14 mg/dL (ref 6–20)
CO2: 23 mmol/L (ref 22–32)
Calcium: 9 mg/dL (ref 8.9–10.3)
Chloride: 102 mmol/L (ref 101–111)
Creatinine, Ser: 0.6 mg/dL (ref 0.44–1.00)
GFR calc Af Amer: >60 mL/min (ref >60)
GFR calc non Af Amer: >60 mL/min (ref >60)
GLUCOSE: 163 mg/dL — AB (ref 65–99)
POTASSIUM: 3.4 mmol/L — AB (ref 3.5–5.1)
SODIUM: 135 mmol/L (ref 135–145)
TOTAL PROTEIN: 7.3 g/dL (ref 6.5–8.1)

## 2016-11-10 LAB — IRON AND TIBC
Iron: 109 ug/dL (ref 28–170)
SATURATION RATIOS: 32 % — AB (ref 10.4–31.8)
TIBC: 346 ug/dL (ref 250–450)
UIBC: 237 ug/dL

## 2016-11-10 LAB — FERRITIN: Ferritin: 414 ng/mL — ABNORMAL HIGH (ref 11–307)

## 2016-11-10 NOTE — Progress Notes (Signed)
Hematology/Oncology Consult note [facility] [phone] [phone]  Patient Care Team: [facility] as PCP - General   Name of the patient: Helen Martinez  [id number]  [birth year]    Reason for referral- elevated ferritin                                           Referring physician- Toy Cookey FNP  Date of visit: 11/10/16   History of presenting illness- Patient is a 46 year old female with a past medical history significant for hypertension and porphyria cutaneous tarda. She has not seen hematology before. Recent blood work from 10/27/2016 was as follows. CBC showed white count of 7.9, H&H of 14.8/42.3 with an MCV of 94 and a platelet count of 211. Iron studies showed iron saturation of 53% and ferritin of 559. Serum iron was elevated at 162. BMP was within normal limits. AST and ALT were mildly elevated at 45 and 47 respectively.  Patient states she was diagnosed with porphyria cutanea tarda about 20 something years ago around 1994 at that time she used to get recurrent blistering of her skin and she underwent 24-hour urine tests by a dermatologist who confirmed the diagnosis. This was in [place]. She was sent to primary care doctor to undergo phlebotomies but does not remember seeing the hematologist. Patient has not had any phlebotomies since 1994-95. Patient smokes about 1 pack of cigarettes per day and has been doing so over 20 years. She drinks about 2 glasses of wine every other day. She has never used birth control or estrogen supplements. She has not been tested for hepatitis in the past. Currently patient states that she gets blistering of her skin very occasionally but it has not been much of an acute issue. Denies any symptoms of nausea, vomiting, abdominal pain or neurological symptoms such as seizures. Her father died about 20 years ago and she does not remember much of her family history  ECOG PS- 0  Pain  scale- 0   Review of systems- Review of Systems  Constitutional: Negative for chills, fever, malaise/fatigue and weight loss.  HENT: Negative for congestion, ear discharge and nosebleeds.   Eyes: Negative for blurred vision.  Respiratory: Negative for cough, hemoptysis, sputum production, shortness of breath and wheezing.   Cardiovascular: Negative for chest pain, palpitations, orthopnea and claudication.  Gastrointestinal: Negative for abdominal pain, blood in stool, constipation, diarrhea, heartburn, melena, nausea and vomiting.  Genitourinary: Negative for dysuria, flank pain, frequency, hematuria and urgency.  Musculoskeletal: Negative for back pain, joint pain and myalgias.  Skin: Negative for rash.  Neurological: Negative for dizziness, tingling, focal weakness, seizures, weakness and headaches.  Endo/Heme/Allergies: Does not bruise/bleed easily.  Psychiatric/Behavioral: Negative for depression and suicidal ideas. The patient does not have insomnia.    No Known Allergies   Patient Active Problem List   Diagnosis Date Noted  . Anal fissure 06/02/2016  . Rectal pain 06/02/2016  . Loose stools 06/02/2016  . Contusion of lower back 09/18/2013     Past Medical History:  Diagnosis Date  . Depression   . GERD (gastroesophageal reflux disease)   . Hypertension      Past Surgical History:  Procedure Laterality Date  . ABDOMINAL HYSTERECTOMY      Social History   Social History  . Marital status: Single    Spouse name: N/A  .  Number of children: 2  . Years of education: N/A   Occupational History  . LPN    Social History Main Topics  . Smoking status: Current Every Day Smoker    Packs/day: 1.00    Types: Cigarettes  . Smokeless tobacco: Never Used  . Alcohol use Yes     Comment: wine 2-3 times a week  . Drug use: No  . Sexual activity: Yes    Birth control/ protection: None   Other Topics Concern  . Not on file   Social History Narrative  . No narrative  on file     Family History  Problem Relation Age of Onset  . Hypertension Mother   . Cancer Father     mouth then mets  . Breast cancer Maternal Aunt      Current Outpatient Prescriptions:  .  amLODipine (NORVASC) 10 MG tablet, Take 10 mg by mouth daily., Disp: , Rfl:  .  hydrochlorothiazide (HYDRODIURIL) 25 MG tablet, Take 1 tablet by mouth daily., Disp: , Rfl: 2 .  ibuprofen (ADVIL,MOTRIN) 200 MG tablet, Take 800 mg by mouth every 6 (six) hours as needed for mild pain or moderate pain., Disp: , Rfl:  .  losartan (COZAAR) 50 MG tablet, Take 1 tablet by mouth daily., Disp: , Rfl: 2 .  omeprazole (PRILOSEC OTC) 20 MG tablet, Take 20 mg by mouth daily., Disp: , Rfl:  .  venlafaxine XR (EFFEXOR-XR) 150 MG 24 hr capsule, Take 150 mg by mouth daily with breakfast., Disp: , Rfl:  .  diltiazem 2 % GEL, Apply 1 application topically 3 (three) times daily. (Patient not taking: Reported on 11/10/2016), Disp: 30 g, Rfl: 1   Physical exam:  Vitals:   11/10/16 1344  BP: (!) 139/99  Pulse: (!) 101  Resp: 18  Temp: 97.1 F (36.2 C)  TempSrc: Tympanic  Weight: 176 lb 5.9 oz (80 kg)  Height: 5' 2.99" (1.6 m)   Physical Exam  Constitutional: She is oriented to person, place, and time and well-developed, well-nourished, and in no distress.  HENT:  Head: Normocephalic and atraumatic.  Eyes: EOM are normal. Pupils are equal, round, and reactive to light.  Neck: Normal range of motion.  Cardiovascular: Normal rate, regular rhythm and normal heart sounds.   Pulmonary/Chest: Effort normal and breath sounds normal.  Abdominal: Soft. Bowel sounds are normal.  Neurological: She is alert and oriented to person, place, and time.  Skin: Skin is warm and dry.      Assessment and plan- Patient is a 46 y.o. female who has been referred to [place] for possible history of porphyria cutanea tarda and elevated ferritin  1. With regards to elevated ferritin-I will check CBC, CMP, ferritin and iron studies.  Porphyria cutanea tarda can sometimes coexist with hereditary hemochromatosis. Given that patient has an elevated ferritin as well as a transferrin saturation of 53%, I will proceed with an HFE gene testing at this time. I will order total plasma porphyrin as an initial screening test for PCT. If it comes back positive I will order further testing. I will check for HIV, hepatitis B and hepatitis C at this time. I will see the patient back in about 3 weeks' time and discuss the results of blood work and further management. I encouraged the patient to quit smoking as well as cut down on the use of alcohol as this can further cause worsening of her LFTs which were mildly abnormal and could be harmful in the setting  of PCT as well. Patient has been educated to avoid iron supplements and use of estrogen products as well. I explained to the patient the etiopathogenesis of Pct briefly and potential complications due to that. We may have to consider restarting phlebotomy based on the results of her blodowork   Thank you for this kind referral and the opportunity to participate in the care of this patient   Visit Diagnosis 1. High serum ferritin   2. History of porphyria     Dr. Owens SharkArchana Alyia Lacerte, MD, MPH [facility] Pager- [phone] 11/10/2016  2:35 PM

## 2016-11-10 NOTE — Progress Notes (Signed)
Referred here by Joycelyn SchmidEmily Hedrrick NP . 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

## 2016-11-11 LAB — HEPATITIS B CORE ANTIBODY, TOTAL: Hep B Core Total Ab: NEGATIVE

## 2016-11-11 LAB — HEPATITIS B SURFACE ANTIGEN: Hepatitis B Surface Ag: NEGATIVE

## 2016-11-11 LAB — HEPATITIS C ANTIBODY: HCV Ab: <0.1 s/co ratio (ref 0.0–0.9)

## 2016-11-11 LAB — HIV ANTIBODY (ROUTINE TESTING W REFLEX): HIV SCREEN 4TH GENERATION: NONREACTIVE

## 2016-11-14 LAB — HEMOCHROMATOSIS DNA-PCR(C282Y,H63D)

## 2016-11-15 MED FILL — VENLAFAXINE HCL ER 150 MG C: 150 | 90 days supply | Qty: 90 | Fill #3

## 2016-11-18 MED FILL — HYDROCHLOROTHIAZIDE 25 MG T: 25 | 30 days supply | Qty: 30 | Fill #0

## 2016-11-18 MED FILL — LOSARTAN POTASSIUM 50 MG TA: 50 | 30 days supply | Qty: 30 | Fill #0

## 2016-11-18 MED FILL — AMLODIPINE BESYLATE 5 MG TA: 5 | 30 days supply | Qty: 30 | Fill #0

## 2016-11-24 LAB — PORPHYRINS, FRACTIONATION-PLASMA
Coproporphyrin.: <1 ug/dL (ref 0.0–1.0)
Heptacarboxyl Porphyrins: <1 ug/dL (ref 0.0–1.0)
Hexacarboxyl Porphyrins: <1 ug/dL (ref 0.0–1.0)
Protoporphyrin: <1 ug/dL (ref 0.0–1.0)

## 2016-11-25 LAB — MISC LABCORP TEST (SEND OUT): Labcorp test code: 823202

## 2016-11-29 ENCOUNTER — Inpatient Hospital Stay: Payer: 59 | Admitting: Oncology

## 2016-11-30 ENCOUNTER — Other Ambulatory Visit: Payer: Self-pay | Admitting: Oncology

## 2016-11-30 DIAGNOSIS — Z8639 Personal history of other endocrine, nutritional and metabolic disease: Secondary | ICD-10-CM

## 2016-12-01 ENCOUNTER — Other Ambulatory Visit: Payer: Self-pay | Admitting: *Deleted

## 2016-12-02 ENCOUNTER — Other Ambulatory Visit: Payer: Self-pay | Admitting: *Deleted

## 2016-12-05 ENCOUNTER — Other Ambulatory Visit: Payer: Self-pay | Admitting: *Deleted

## 2016-12-09 ENCOUNTER — Other Ambulatory Visit: Payer: Self-pay | Admitting: *Deleted

## 2016-12-09 ENCOUNTER — Inpatient Hospital Stay: Payer: 59 | Attending: Oncology

## 2016-12-09 DIAGNOSIS — Z8639 Personal history of other endocrine, nutritional and metabolic disease: Secondary | ICD-10-CM

## 2016-12-12 LAB — MISC LABCORP TEST (SEND OUT)
LABCORP TEST CODE: 10165
Labcorp test code: 3053

## 2016-12-13 LAB — MISC LABCORP TEST (SEND OUT): Labcorp test code: 120980

## 2016-12-13 MED FILL — AMLODIPINE BESYLATE 5 MG TA: 5 | 30 days supply | Qty: 30 | Fill #1

## 2016-12-13 MED FILL — LOSARTAN POTASSIUM 50 MG TA: 50 | 30 days supply | Qty: 30 | Fill #1

## 2016-12-13 MED FILL — HYDROCHLOROTHIAZIDE 25 MG T: 25 | 30 days supply | Qty: 30 | Fill #1

## 2016-12-19 ENCOUNTER — Telehealth: Payer: Self-pay | Admitting: *Deleted

## 2016-12-19 NOTE — Telephone Encounter (Signed)
Called pt to let her know that labs are back and would like to make appt for pat she is agreeable to 4/26 3 pm. Sent message to scheduling to get it on the schedule.

## 2016-12-19 NOTE — Progress Notes (Signed)
Pt coming 4/26 3 pm

## 2016-12-19 NOTE — Progress Notes (Signed)
Schedule her f/u with me sometime next week

## 2016-12-26 ENCOUNTER — Other Ambulatory Visit: Payer: Self-pay | Admitting: *Deleted

## 2016-12-26 DIAGNOSIS — R7989 Other specified abnormal findings of blood chemistry: Secondary | ICD-10-CM

## 2016-12-29 ENCOUNTER — Inpatient Hospital Stay: Payer: 59

## 2016-12-29 ENCOUNTER — Inpatient Hospital Stay: Payer: 59 | Admitting: Oncology

## 2017-01-05 ENCOUNTER — Other Ambulatory Visit: Payer: Self-pay

## 2017-01-05 ENCOUNTER — Inpatient Hospital Stay: Payer: 59

## 2017-01-05 ENCOUNTER — Inpatient Hospital Stay: Payer: 59 | Attending: Oncology

## 2017-01-05 ENCOUNTER — Inpatient Hospital Stay (HOSPITAL_BASED_OUTPATIENT_CLINIC_OR_DEPARTMENT_OTHER): Payer: 59 | Admitting: Oncology

## 2017-01-05 DIAGNOSIS — I10 Essential (primary) hypertension: Secondary | ICD-10-CM | POA: Diagnosis not present

## 2017-01-05 DIAGNOSIS — R7989 Other specified abnormal findings of blood chemistry: Secondary | ICD-10-CM

## 2017-01-05 DIAGNOSIS — F17210 Nicotine dependence, cigarettes, uncomplicated: Secondary | ICD-10-CM | POA: Diagnosis not present

## 2017-01-05 DIAGNOSIS — K219 Gastro-esophageal reflux disease without esophagitis: Secondary | ICD-10-CM | POA: Diagnosis not present

## 2017-01-05 DIAGNOSIS — F329 Major depressive disorder, single episode, unspecified: Secondary | ICD-10-CM | POA: Insufficient documentation

## 2017-01-05 LAB — CBC
HCT: 42 % (ref 35.0–47.0)
HEMOGLOBIN: 14.7 g/dL (ref 12.0–16.0)
MCH: 33.4 pg (ref 26.0–34.0)
MCHC: 34.9 g/dL (ref 32.0–36.0)
MCV: 95.5 fL (ref 80.0–100.0)
PLATELETS: 187 10*3/uL (ref 150–440)
RBC: 4.4 MIL/uL (ref 3.80–5.20)
RDW: 12.7 % (ref 11.5–14.5)
WBC: 8.5 10*3/uL (ref 3.6–11.0)

## 2017-01-05 LAB — FERRITIN: FERRITIN: 386 ng/mL — AB (ref 11–307)

## 2017-01-05 NOTE — Progress Notes (Signed)
Patient here today for follow up.  Patient states no new concerns today  

## 2017-01-08 ENCOUNTER — Encounter: Payer: Self-pay | Admitting: Oncology

## 2017-01-08 NOTE — Progress Notes (Signed)
Hematology/Oncology Consult note [facility]  [phone] [phone]  Patient Care Team: [facility] as PCP - General   Name of the patient: Helen Martinez  [id number]  [birth year]   Date of visit: 01/08/17  Diagnosis- porphyria cutanea tarda  Chief complaint/ Reason for visit- discuss results of bloodwork  Heme/Onc history: Patient is a 46 year old female with a past medical history significant for hypertension and porphyria?cutaneous tarda. She has not seen hematology before. Recent blood work from 10/27/2016 was as follows. CBC showed white count of 7.9, H&H of 14.8/42.3 with an MCV of 94 and a platelet count of 211. Iron studies showed iron saturation of 53% and ferritin of 559. Serum iron was elevated at 162. BMP was within normal limits. AST and ALT were mildly elevated at 45 and 47 respectively.  Patient states she was diagnosed with porphyria cutanea tarda about 20 something years ago around 1994 at that time she used to get recurrent blistering of her skin and she underwent 24-hour urine tests by a dermatologist who confirmed the diagnosis. This was in [place]. She was sent to primary care doctor to undergo phlebotomies but does not remember seeing the hematologist. Patient has not had any phlebotomies since 1994-95. Patient smokes about 1 pack of cigarettes per day and has been doing so over 20 years. She drinks about 2 glasses of wine every other day. She has never used birth control or estrogen supplements. She has not been tested for hepatitis in the past. Currently patient states that she gets blistering of her skin very occasionally but it has not been much of an acute issue. Denies any symptoms of nausea, vomiting, abdominal pain or neurological symptoms such as seizures. Her father died about 20 years ago and she does not remember much of her family history   Her further work up was as follows:  cbc was  normal. cmp showed mildly elevated AST of 44. ferritin was elevated at 414 and iron saturation was 33%. HIV, hep B and C testing was negative. 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 total plasma porphyrin elevated at 2.7 (upper limit of normal 1) but all the fractionated components were normal. quantitative urine porphobilinogen normal at 0.7. Free erythrocyte prophyrin and zinc protoporphyrin normal.   PORPHYRINS QUANTITATIVE URINE RANDOM  Uroporphyrins (UP) 311 [H ] ug/L BN  Reference Range: 0-20  Heptacarboxyl (7-CP) 163 [H ] ug/L BN  Reference Range: 0-2  Hexacarboxyl (6-CP) 3 [H ] ug/L BN  Reference Range: 0-1  Pentacarboxyl (5-CP) 122 [H ] ug/L BN  Reference Range: 0-2  Coproporphyrin (CP) I 135 [H ] ug/L BN  Reference Range: 0-15  Coproporphyrin (CP) III 27 ug/L BN  Reference Range: 0-49    Interval history- she is doing well. Denies any complaints today    Review of systems- Review of Systems  Constitutional: Negative for chills, fever, malaise/fatigue and weight loss.  HENT: Negative for congestion, ear discharge and nosebleeds.   Eyes: Negative for blurred vision.  Respiratory: Negative for cough, hemoptysis, sputum production, shortness of breath and wheezing.   Cardiovascular: Negative for chest pain, palpitations, orthopnea and claudication.  Gastrointestinal: Negative for abdominal pain, blood in stool, constipation, diarrhea, heartburn, melena, nausea and vomiting.  Genitourinary: Negative for dysuria, flank pain, frequency, hematuria and urgency.  Musculoskeletal: Negative for back pain, joint pain and myalgias.  Skin: Negative for rash.  Neurological: Negative for dizziness, tingling, focal weakness, seizures, weakness and headaches.  Endo/Heme/Allergies:  Does not bruise/bleed easily.  Psychiatric/Behavioral: Negative for depression and suicidal ideas. The patient does not have insomnia.       Allergies  Allergen Reactions  . Lisinopril  Cough     Past Medical History:  Diagnosis Date  . Depression   . GERD (gastroesophageal reflux disease)   . Hypertension      Past Surgical History:  Procedure Laterality Date  . ABDOMINAL HYSTERECTOMY      Social History   Social History  . Marital status: Single    Spouse name: N/A  . Number of children: 2  . Years of education: N/A   Occupational History  . LPN    Social History Main Topics  . Smoking status: Current Every Day Smoker    Packs/day: 1.00    Types: Cigarettes  . Smokeless tobacco: Never Used  . Alcohol use Yes     Comment: wine 2-3 times a week  . Drug use: No  . Sexual activity: Yes    Birth control/ protection: None   Other Topics Concern  . Not on file   Social History Narrative  . No narrative on file    Family History  Problem Relation Age of Onset  . Hypertension Mother   . Cancer Father     mouth then mets  . Breast cancer Maternal Aunt      Current Outpatient Prescriptions:  .  amLODipine (NORVASC) 10 MG tablet, Take 10 mg by mouth daily., Disp: , Rfl:  .  diltiazem 2 % GEL, Apply 1 application topically 3 (three) times daily., Disp: 30 g, Rfl: 1 .  hydrochlorothiazide (HYDRODIURIL) 25 MG tablet, Take 1 tablet by mouth daily., Disp: , Rfl: 2 .  ibuprofen (ADVIL,MOTRIN) 200 MG tablet, Take 800 mg by mouth every 6 (six) hours as needed for mild pain or moderate pain., Disp: , Rfl:  .  losartan (COZAAR) 50 MG tablet, Take 1 tablet by mouth daily., Disp: , Rfl: 2 .  omeprazole (PRILOSEC OTC) 20 MG tablet, Take 20 mg by mouth daily., Disp: , Rfl:  .  venlafaxine XR (EFFEXOR-XR) 150 MG 24 hr capsule, Take 150 mg by mouth daily with breakfast., Disp: , Rfl:   Physical exam:  Vitals:   01/05/17 1445  BP: 108/74  Pulse: 92  Temp: (!) 94.8 F (34.9 C)  TempSrc: Tympanic  Weight: 176 lb 2 oz (79.9 kg)   Physical Exam  Constitutional: She is oriented to person, place, and time and well-developed, well-nourished, and in no  distress.  HENT:  Head: Normocephalic and atraumatic.  Eyes: EOM are normal. Pupils are equal, round, and reactive to light.  Neck: Normal range of motion.  Cardiovascular: Normal rate, regular rhythm and normal heart sounds.   Pulmonary/Chest: Effort normal and breath sounds normal.  Abdominal: Soft. Bowel sounds are normal.  Neurological: She is alert and oriented to person, place, and time.  Skin: Skin is warm and dry.     CMP Latest Ref Rng & Units 11/10/2016  Glucose 65 - 99 mg/dL 409(W163(H)  BUN 6 - 20 mg/dL 14  Creatinine 1.190.44 - 1.471.00 mg/dL 8.290.60  Sodium 562135 - 130145 mmol/L 135  Potassium 3.5 - 5.1 mmol/L 3.4(L)  Chloride 101 - 111 mmol/L 102  CO2 22 - 32 mmol/L 23  Calcium 8.9 - 10.3 mg/dL 9.0  Total Protein 6.5 - 8.1 g/dL 7.3  Total Bilirubin 0.3 - 1.2 mg/dL 0.7  Alkaline Phos 38 - 126 U/L 39  AST 15 - 41  U/L 44(H)  ALT 14 - 54 U/L 41   CBC Latest Ref Rng & Units 01/05/2017  WBC 3.6 - 11.0 K/uL 8.5  Hemoglobin 12.0 - 16.0 g/dL 16.1  Hematocrit 09.6 - 47.0 % 42.0  Platelets 150 - 440 K/uL 187     Assessment and plan- Patient is a 46 y.o. female with h/o porphyria cutanea tarda here to discuss results of bloodwork and re establish care  I discussed the results of blood work with the patient.Based on the fact that her total porphyrin was elevated along with elevated elevated quantitative urine porphyrin and the co existing H63D with elevated ferritin, she does meet criteria for diagnosis of porphyria cutanea tarda. I discussed the natural history of porphyria cutanea tarda with the patient and symptoms associated thereof. Her ongoing smoking and occasional alcohol use and potentially worsen her symptoms including skin blistering and can also cause liver dysfunction with co existent hemochromatosis. I discussed the natural history of hemachromatosis and the fact that she is heterozygous for H63D typically does not lead to iron overload by itself. That alcohol can serve as as risk factor  to make this worse. I strongly encouraged her to quit smoking and abstain from alcohol. Patient would like to think about the same.  Also discussed phlebotomy as a treatment for porphyria cutanea tarda to keep her ferritin <25. We will make arrangements for Q2 weekly phlebotomy and take out 500 cc of blood each time. Replace with 500 cc NS prn based on symptoms. We will aim to keep her ferritin <25 if her H/H permits it. She will get bloodwork at  office a couple of days prior to her phlebotomy. Once we attain a level of ferritin <25, she may need maintenance phlebotomy from time to time when her ferritin is >100.   Patient understands and is in agreement with the above treatment plan. I will see her ack in 3 months time   Total face to face encounter time for this patient visit was 30 min. >50% of the time was  spent in counseling and coordination of care.      Visit Diagnosis 1. Porphyria cutanea tarda (HCC)      Dr. Owens Shark, MD, MPH [facility] at [facility] Pager- [phone] 01/08/2017 8:41 AM

## 2017-01-11 ENCOUNTER — Inpatient Hospital Stay: Payer: 59

## 2017-01-11 DIAGNOSIS — F17210 Nicotine dependence, cigarettes, uncomplicated: Secondary | ICD-10-CM | POA: Diagnosis not present

## 2017-01-11 DIAGNOSIS — I10 Essential (primary) hypertension: Secondary | ICD-10-CM | POA: Diagnosis not present

## 2017-01-11 DIAGNOSIS — K219 Gastro-esophageal reflux disease without esophagitis: Secondary | ICD-10-CM | POA: Diagnosis not present

## 2017-01-11 DIAGNOSIS — F329 Major depressive disorder, single episode, unspecified: Secondary | ICD-10-CM | POA: Diagnosis not present

## 2017-01-11 MED ORDER — SODIUM CHLORIDE 0.9 % IV SOLN
Freq: Once | INTRAVENOUS | Status: DC
  Filled 2017-01-11: qty 1000

## 2017-01-17 MED FILL — AMLODIPINE BESYLATE 5 MG TA: 5 | 30 days supply | Qty: 30 | Fill #2

## 2017-01-17 MED FILL — LOSARTAN POTASSIUM 50 MG TA: 50 | 30 days supply | Qty: 30 | Fill #2

## 2017-01-17 MED FILL — HYDROCHLOROTHIAZIDE 25 MG T: 25 | 30 days supply | Qty: 30 | Fill #2

## 2017-01-18 ENCOUNTER — Other Ambulatory Visit: Payer: Self-pay | Admitting: *Deleted

## 2017-01-19 ENCOUNTER — Other Ambulatory Visit: Payer: Self-pay | Admitting: *Deleted

## 2017-01-23 ENCOUNTER — Other Ambulatory Visit: Payer: Self-pay

## 2017-01-23 ENCOUNTER — Other Ambulatory Visit: Payer: 59

## 2017-01-23 ENCOUNTER — Telehealth: Payer: Self-pay | Admitting: *Deleted

## 2017-01-23 LAB — HEMOGLOBIN AND HEMATOCRIT, BLOOD
HEMATOCRIT: 39.5 % (ref 35.0–45.0)
HEMOGLOBIN: 12.9 g/dL (ref 11.7–15.5)

## 2017-01-23 NOTE — Telephone Encounter (Signed)
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 I will try for tom.

## 2017-01-24 LAB — FERRITIN: Ferritin: 306 ng/mL — ABNORMAL HIGH (ref 10–232)

## 2017-01-25 ENCOUNTER — Inpatient Hospital Stay: Payer: 59

## 2017-01-25 DIAGNOSIS — K219 Gastro-esophageal reflux disease without esophagitis: Secondary | ICD-10-CM | POA: Diagnosis not present

## 2017-01-25 DIAGNOSIS — I10 Essential (primary) hypertension: Secondary | ICD-10-CM | POA: Diagnosis not present

## 2017-01-25 DIAGNOSIS — F17210 Nicotine dependence, cigarettes, uncomplicated: Secondary | ICD-10-CM | POA: Diagnosis not present

## 2017-01-25 DIAGNOSIS — F329 Major depressive disorder, single episode, unspecified: Secondary | ICD-10-CM | POA: Diagnosis not present

## 2017-01-27 ENCOUNTER — Telehealth: Payer: Self-pay

## 2017-01-27 NOTE — Telephone Encounter (Signed)
Patient requesting a work note for her appt on 5/21 and a copy of lab results faxed to her.    Done./Marvin Maenza S

## 2017-02-06 ENCOUNTER — Other Ambulatory Visit: Payer: 59

## 2017-02-06 LAB — HEMOGLOBIN AND HEMATOCRIT, BLOOD
HCT: 38.1 % (ref 35.0–45.0)
Hemoglobin: 12.5 g/dL (ref 11.7–15.5)

## 2017-02-07 LAB — FERRITIN: Ferritin: 351 ng/mL — ABNORMAL HIGH (ref 10–232)

## 2017-02-08 ENCOUNTER — Inpatient Hospital Stay: Payer: 59 | Attending: Oncology

## 2017-02-08 DIAGNOSIS — I10 Essential (primary) hypertension: Secondary | ICD-10-CM | POA: Diagnosis not present

## 2017-02-08 DIAGNOSIS — K219 Gastro-esophageal reflux disease without esophagitis: Secondary | ICD-10-CM | POA: Diagnosis not present

## 2017-02-08 DIAGNOSIS — F329 Major depressive disorder, single episode, unspecified: Secondary | ICD-10-CM | POA: Insufficient documentation

## 2017-02-08 DIAGNOSIS — F17210 Nicotine dependence, cigarettes, uncomplicated: Secondary | ICD-10-CM | POA: Insufficient documentation

## 2017-02-16 MED FILL — VENLAFAXINE HCL ER 150 MG C: 150 | 90 days supply | Qty: 90 | Fill #4

## 2017-02-17 MED FILL — AMLODIPINE BESYLATE 5 MG TA: 5 | 30 days supply | Qty: 30 | Fill #0

## 2017-02-20 ENCOUNTER — Other Ambulatory Visit: Payer: 59

## 2017-02-20 ENCOUNTER — Other Ambulatory Visit: Payer: Self-pay

## 2017-02-20 LAB — HEMOGLOBIN AND HEMATOCRIT, BLOOD
HCT: 39 % (ref 35.0–45.0)
Hemoglobin: 12.5 g/dL (ref 11.7–15.5)

## 2017-02-21 LAB — FERRITIN: FERRITIN: 194 ng/mL (ref 10–232)

## 2017-02-21 MED FILL — HYDROCHLOROTHIAZIDE 25 MG T: 25 | 30 days supply | Qty: 30 | Fill #0

## 2017-02-21 MED FILL — LOSARTAN POTASSIUM 50 MG TA: 50 | 30 days supply | Qty: 30 | Fill #0

## 2017-02-22 ENCOUNTER — Inpatient Hospital Stay: Payer: 59

## 2017-02-22 DIAGNOSIS — K219 Gastro-esophageal reflux disease without esophagitis: Secondary | ICD-10-CM | POA: Diagnosis not present

## 2017-02-22 DIAGNOSIS — F17210 Nicotine dependence, cigarettes, uncomplicated: Secondary | ICD-10-CM | POA: Diagnosis not present

## 2017-02-22 DIAGNOSIS — F329 Major depressive disorder, single episode, unspecified: Secondary | ICD-10-CM | POA: Diagnosis not present

## 2017-02-22 DIAGNOSIS — I10 Essential (primary) hypertension: Secondary | ICD-10-CM | POA: Diagnosis not present

## 2017-02-23 ENCOUNTER — Other Ambulatory Visit: Payer: Self-pay | Admitting: Oncology

## 2017-03-06 ENCOUNTER — Other Ambulatory Visit: Payer: 59

## 2017-03-06 LAB — HEMOGLOBIN AND HEMATOCRIT, BLOOD
HCT: 36.9 % (ref 35.0–45.0)
Hemoglobin: 11.9 g/dL (ref 11.7–15.5)

## 2017-03-07 LAB — FERRITIN: Ferritin: 145 ng/mL (ref 10–232)

## 2017-03-09 ENCOUNTER — Inpatient Hospital Stay: Payer: 59 | Attending: Oncology

## 2017-03-09 DIAGNOSIS — F17210 Nicotine dependence, cigarettes, uncomplicated: Secondary | ICD-10-CM | POA: Diagnosis not present

## 2017-03-09 DIAGNOSIS — K219 Gastro-esophageal reflux disease without esophagitis: Secondary | ICD-10-CM | POA: Diagnosis not present

## 2017-03-09 DIAGNOSIS — F329 Major depressive disorder, single episode, unspecified: Secondary | ICD-10-CM | POA: Insufficient documentation

## 2017-03-09 DIAGNOSIS — I10 Essential (primary) hypertension: Secondary | ICD-10-CM | POA: Insufficient documentation

## 2017-03-22 ENCOUNTER — Inpatient Hospital Stay: Payer: 59

## 2017-03-22 ENCOUNTER — Other Ambulatory Visit: Payer: 59

## 2017-03-22 LAB — HEMOGLOBIN AND HEMATOCRIT, BLOOD
HEMATOCRIT: 38.2 % (ref 35.0–45.0)
HEMOGLOBIN: 12.6 g/dL (ref 11.7–15.5)

## 2017-03-22 NOTE — Progress Notes (Signed)
Patient presents to infusion area for her therapeutic phlebotomy.  She reports that she forgot to go get her labs drawn on Monday and she had them drawn this morning.  Spoke with Boyd Kerbsenny in the lab at [facility] in [place] and she reports that the lab will be a send out and it will be batched to go out at 1630 today.  Lab results not back for treatment today.  Spoke with Limited BrandsSherry RN.  Pt will call tomorrow to see if she needs a phlebotomy and reschedule accordingly.  Cordelia PenSherry RN aware of plan

## 2017-03-23 LAB — FERRITIN: Ferritin: 66 ng/mL (ref 10–232)

## 2017-03-24 ENCOUNTER — Encounter: Payer: Self-pay | Admitting: *Deleted

## 2017-03-24 ENCOUNTER — Inpatient Hospital Stay: Payer: 59

## 2017-03-24 DIAGNOSIS — F17210 Nicotine dependence, cigarettes, uncomplicated: Secondary | ICD-10-CM | POA: Diagnosis not present

## 2017-03-24 DIAGNOSIS — K219 Gastro-esophageal reflux disease without esophagitis: Secondary | ICD-10-CM | POA: Diagnosis not present

## 2017-03-24 DIAGNOSIS — F329 Major depressive disorder, single episode, unspecified: Secondary | ICD-10-CM | POA: Diagnosis not present

## 2017-03-24 DIAGNOSIS — I10 Essential (primary) hypertension: Secondary | ICD-10-CM | POA: Diagnosis not present

## 2017-03-27 MED FILL — HYDROCHLOROTHIAZIDE 25 MG T: 25 | 30 days supply | Qty: 30 | Fill #1

## 2017-03-27 MED FILL — AMLODIPINE BESYLATE 5 MG TA: 5 | 30 days supply | Qty: 30 | Fill #1

## 2017-03-27 MED FILL — LOSARTAN POTASSIUM 50 MG TA: 50 | 30 days supply | Qty: 30 | Fill #1

## 2017-04-03 ENCOUNTER — Other Ambulatory Visit: Payer: 59

## 2017-04-03 LAB — HEMOGLOBIN AND HEMATOCRIT, BLOOD
HEMATOCRIT: 37.5 % (ref 35.0–45.0)
HEMOGLOBIN: 12.1 g/dL (ref 11.7–15.5)

## 2017-04-04 LAB — FERRITIN: Ferritin: 40 ng/mL (ref 10–232)

## 2017-04-05 ENCOUNTER — Inpatient Hospital Stay: Payer: 59 | Attending: Oncology

## 2017-04-05 DIAGNOSIS — I10 Essential (primary) hypertension: Secondary | ICD-10-CM | POA: Diagnosis not present

## 2017-04-05 DIAGNOSIS — F17210 Nicotine dependence, cigarettes, uncomplicated: Secondary | ICD-10-CM | POA: Insufficient documentation

## 2017-04-05 DIAGNOSIS — K219 Gastro-esophageal reflux disease without esophagitis: Secondary | ICD-10-CM | POA: Diagnosis not present

## 2017-04-05 DIAGNOSIS — Z79899 Other long term (current) drug therapy: Secondary | ICD-10-CM | POA: Diagnosis not present

## 2017-04-05 DIAGNOSIS — F329 Major depressive disorder, single episode, unspecified: Secondary | ICD-10-CM | POA: Diagnosis not present

## 2017-04-06 ENCOUNTER — Other Ambulatory Visit: Payer: Self-pay | Admitting: Family Medicine

## 2017-04-06 DIAGNOSIS — Z1231 Encounter for screening mammogram for malignant neoplasm of breast: Secondary | ICD-10-CM

## 2017-04-17 ENCOUNTER — Other Ambulatory Visit: Payer: 59

## 2017-04-17 LAB — HEMOGLOBIN AND HEMATOCRIT, BLOOD
HCT: 36.6 % (ref 35.0–45.0)
Hemoglobin: 11.5 g/dL — ABNORMAL LOW (ref 11.7–15.5)

## 2017-04-18 ENCOUNTER — Telehealth: Payer: Self-pay | Admitting: *Deleted

## 2017-04-18 LAB — FERRITIN: FERRITIN: 15 ng/mL (ref 10–232)

## 2017-04-18 NOTE — Telephone Encounter (Signed)
Called pt to let her know that her ferritin is 15 and if she is below 25 we are to hold phlebotomy so I am cancelling the phlebotomy for tom. She is agreeable to the plan. She will have her labs done in [place] 8/28 and see md 8/30

## 2017-04-19 ENCOUNTER — Inpatient Hospital Stay: Payer: 59

## 2017-05-02 ENCOUNTER — Other Ambulatory Visit: Payer: 59

## 2017-05-02 LAB — HEMOGLOBIN AND HEMATOCRIT, BLOOD
HEMATOCRIT: 36.2 % (ref 35.0–45.0)
Hemoglobin: 11.5 g/dL — ABNORMAL LOW (ref 11.7–15.5)

## 2017-05-03 ENCOUNTER — Ambulatory Visit
Admission: RE | Admit: 2017-05-03 | Discharge: 2017-05-03 | Disposition: A | Discharge status: Home / Self Care | Payer: 59 | Source: Ambulatory Visit | Attending: Family Medicine | Admitting: Family Medicine

## 2017-05-03 DIAGNOSIS — Z1231 Encounter for screening mammogram for malignant neoplasm of breast: Secondary | ICD-10-CM | POA: Insufficient documentation

## 2017-05-03 DIAGNOSIS — R921 Mammographic calcification found on diagnostic imaging of breast: Secondary | ICD-10-CM | POA: Insufficient documentation

## 2017-05-03 DIAGNOSIS — R928 Other abnormal and inconclusive findings on diagnostic imaging of breast: Secondary | ICD-10-CM | POA: Diagnosis not present

## 2017-05-03 LAB — FERRITIN: Ferritin: 10 ng/mL (ref 10–232)

## 2017-05-04 ENCOUNTER — Inpatient Hospital Stay (HOSPITAL_BASED_OUTPATIENT_CLINIC_OR_DEPARTMENT_OTHER): Payer: 59 | Admitting: Oncology

## 2017-05-04 ENCOUNTER — Encounter: Payer: Self-pay | Admitting: Oncology

## 2017-05-04 DIAGNOSIS — F17210 Nicotine dependence, cigarettes, uncomplicated: Secondary | ICD-10-CM

## 2017-05-04 DIAGNOSIS — I10 Essential (primary) hypertension: Secondary | ICD-10-CM | POA: Diagnosis not present

## 2017-05-04 DIAGNOSIS — Z79899 Other long term (current) drug therapy: Secondary | ICD-10-CM

## 2017-05-04 DIAGNOSIS — F329 Major depressive disorder, single episode, unspecified: Secondary | ICD-10-CM | POA: Diagnosis not present

## 2017-05-04 DIAGNOSIS — K219 Gastro-esophageal reflux disease without esophagitis: Secondary | ICD-10-CM | POA: Diagnosis not present

## 2017-05-04 NOTE — Progress Notes (Signed)
Here for follow up. Stated doing well  

## 2017-05-04 NOTE — Progress Notes (Signed)
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 She has not seen hematology before. Recent blood work from 10/27/2016 was as follows. CBC showed white count of 7.9, H&H of 14.8/42.3 with an MCV of 94 and a platelet count of 211. Iron studies showed iron saturation of 53% and ferritin of 559. Serum iron was elevated at 162. BMP was within normal limits. AST and ALT were mildly elevated at 45 and 47 respectively.  Patient states she was diagnosed with porphyria cutanea tarda about 20 something years ago around 1994 at that time she used to get recurrent blistering of her skin and she underwent 24-hour urine tests by a dermatologist who confirmed the diagnosis. This was in [place]. She was sent to primary care doctor to undergo phlebotomies but does not remember seeing the hematologist. Patient has not had any phlebotomies since 1994-95. Patient smokes about 1 pack of cigarettes per day and has been doing so over 20 years. She drinks about 2 glasses of wine every other day. She has never used birth control or estrogen supplements. She has not been tested for hepatitis in the past. Currently patient states that she gets blistering of her skin very occasionally but it has not been much of an acute issue. Denies any symptoms of nausea, vomiting, abdominal pain or neurological symptoms such as seizures. Her father died about 20 years ago and she does not remember much of her family history   Her further work up was as follows:  cbc was normal.  cmp showed mildly elevated AST of 44. ferritin was elevated at 414 and iron saturation was 33%. HIV, hep B and C testing was negative. 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 total plasma porphyrin elevated at 2.7 (upper limit of normal 1) but all the fractionated components were normal. quantitative urine porphobilinogen normal at 0.7. Free erythrocyte prophyrin and zinc protoporphyrin normal.   PORPHYRINS QUANTITATIVE URINE RANDOM  Uroporphyrins (UP) 311 [H ] ug/L BN  Reference Range: 0-20  Heptacarboxyl (7-CP) 163 [H ] ug/L BN  Reference Range: 0-2  Hexacarboxyl (6-CP) 3 [H ] ug/L BN  Reference Range: 0-1  Pentacarboxyl (5-CP) 122 [H ] ug/L BN  Reference Range: 0-2  Coproporphyrin (CP) I 135 [H ] ug/L BN  Reference Range: 0-15  Coproporphyrin (CP) III 27 ug/L BN  Reference Range: 0-49    Interval history- she is doing well. She continues to smoke 1/2-1 PPD and is slowly trying to cut down  ECOG PS- 0 Pain scale- 0   Review of systems- Review of Systems  Constitutional: Negative for chills, fever, malaise/fatigue and weight loss.  HENT: Negative for congestion, ear discharge and nosebleeds.   Eyes: Negative for blurred vision.  Respiratory: Negative for cough, hemoptysis, sputum production, shortness of breath and wheezing.   Cardiovascular: Negative for chest pain, palpitations, orthopnea and claudication.  Gastrointestinal: Negative for abdominal pain, blood in stool, constipation, diarrhea, heartburn, melena, nausea and vomiting.  Genitourinary: Negative for dysuria, flank pain, frequency, hematuria and urgency.  Musculoskeletal: Negative for back pain, joint pain and myalgias.  Skin: Negative for rash.  Neurological: Negative for dizziness, tingling, focal weakness, seizures, weakness and headaches.  Endo/Heme/Allergies: Does not bruise/bleed easily.  Psychiatric/Behavioral: Negative for depression and suicidal ideas. The patient does not have  insomnia.      Allergies  Allergen Reactions  . Lisinopril Cough     Past Medical History:  Diagnosis Date  . Depression   . GERD (gastroesophageal reflux disease)   . Hypertension      Past Surgical History:  Procedure Laterality Date  . ABDOMINAL HYSTERECTOMY      Social History   Social History  . Marital status: Single    Spouse name: N/A  . Number of children: 2  . Years of education: N/A   Occupational History  . LPN    Social History Main Topics  . Smoking status: Current Every Day Smoker    Packs/day: 1.00    Types: Cigarettes  . Smokeless tobacco: Never Used  . Alcohol use Yes     Comment: wine 2-3 times a week  . Drug use: No  . Sexual activity: Yes    Birth control/ protection: None   Other Topics Concern  . Not on file   Social History Narrative  . No narrative on file    Family History  Problem Relation Age of Onset  . Hypertension Mother   . Cancer Father        mouth then mets  . Breast cancer Maternal Aunt 54     Current Outpatient Prescriptions:  .  amLODipine (NORVASC) 10 MG tablet, Take 10 mg by mouth daily., Disp: , Rfl:  .  hydrochlorothiazide (HYDRODIURIL) 25 MG tablet, Take 1 tablet by mouth daily., Disp: , Rfl: 2 .  losartan (COZAAR) 50 MG tablet, Take 1 tablet by mouth daily., Disp: , Rfl: 2 .  omeprazole (PRILOSEC OTC) 20 MG tablet, Take 20 mg by mouth daily., Disp: , Rfl:  .  venlafaxine XR (EFFEXOR-XR) 150 MG 24 hr capsule, Take 150 mg by mouth daily with breakfast., Disp: , Rfl:  .  Albuterol Sulfate (VENTOLIN HFA IN), Ventolin HFA 90 mcg/actuation aerosol inhaler, Disp: , Rfl:  .  diltiazem 2 % GEL, Apply 1 application topically 3 (three) times daily. (Patient not taking: Reported on 05/04/2017), Disp: 30 g, Rfl: 1 .  ibuprofen (ADVIL,MOTRIN) 200 MG tablet, Take 800 mg by mouth every 6 (six) hours as needed for mild pain or moderate pain., Disp: , Rfl:   Physical exam:  Vitals:   05/04/17 1334  BP: (!) 146/90    Pulse: 93  Resp: 18  Temp: 97.9 F (36.6 C)  TempSrc: Tympanic  Weight: 178 lb (80.7 kg)   Physical Exam  Constitutional: She is oriented to person, place, and time and well-developed, well-nourished, and in no distress.  HENT:  Head: Normocephalic and atraumatic.  Eyes: Pupils are equal, round, and reactive to light. EOM are normal.  Neck: Normal range of motion.  Cardiovascular: Normal rate, regular rhythm and normal heart sounds.   Pulmonary/Chest: Effort normal and breath sounds normal.  Abdominal: Soft. Bowel sounds are normal.  Neurological: She is alert and oriented to person, place, and time.  Skin: Skin is warm and dry.     CMP Latest Ref Rng & Units 11/10/2016  Glucose 65 - 99 mg/dL 213(Y163(H)  BUN 6 - 20 mg/dL 14  Creatinine 8.650.44 - 7.841.00 mg/dL 6.960.60  Sodium 295135 - 284145 mmol/L 135  Potassium 3.5 - 5.1 mmol/L 3.4(L)  Chloride 101 - 111 mmol/L 102  CO2 22 -  32 mmol/L 23  Calcium 8.9 - 10.3 mg/dL 9.0  Total Protein 6.5 - 8.1 g/dL 7.3  Total Bilirubin 0.3 - 1.2 mg/dL 0.7  Alkaline Phos 38 - 126 U/L 39  AST 15 - 41 U/L 44(H)  ALT 14 - 54 U/L 41   CBC Latest Ref Rng & Units 05/02/2017  WBC 3.6 - 11.0 K/uL -  Hemoglobin 11.7 - 15.5 g/dL 11.5(L)  Hematocrit 35.0 - 45.0 % 36.2  Platelets 150 - 440 K/uL -    No images are attached to the encounter.  Mm Screening Breast Tomo Bilateral  Result Date: 05/04/2017 CLINICAL DATA:  Screening. EXAM: 2D DIGITAL SCREENING BILATERAL MAMMOGRAM WITH CAD AND ADJUNCT TOMO COMPARISON:  Previous exam(s). ACR Breast Density Category c: The breast tissue is heterogeneously dense, which may obscure small masses. FINDINGS: In the right breast, calcifications warrant further evaluation with magnified views. In the left breast, no findings suspicious for malignancy. Images were processed with CAD. IMPRESSION: Further evaluation is suggested for calcifications in the right breast. RECOMMENDATION: Diagnostic mammogram of the right breast. (Code:FI-R-39M)  The patient will be contacted regarding the findings, and additional imaging will be scheduled. BI-RADS CATEGORY  0: Incomplete. Need additional imaging evaluation and/or prior mammograms for comparison. Electronically Signed   By: Dalphine Handing M.D.   On: 05/04/2017 07:36     Assessment and plan- Patient is a 46 y.o. female with porphyria cutanea tarda  Patient getting phlebotomy Q2 weeks since may 2018. Ferritin finally down to 10. Hold off on phlebotomy at this time. No visible skin blistering. She feels well. Advised her to strongly quit smoking, abstain from alcohol and avoid OCP's. I will see ehr back in 3 months with cbc with diff and ferritin for possible phlebotomy. Phlebotomy to be restarted if ferritin >100. I would prefer to follow ferritin instead of porphyrin levels as it is easy to follow ferritin   Visit Diagnosis 1. Porphyria cutanea tarda (HCC)      Dr. Owens Shark, MD, MPH [facility] Pager- [phone] 05/04/2017 2:25 PM

## 2017-05-09 MED FILL — AMLODIPINE BESYLATE 5 MG TA: 5 | 90 days supply | Qty: 90 | Fill #0

## 2017-05-09 MED FILL — LOSARTAN POTASSIUM 50 MG TA: 50 | 90 days supply | Qty: 90 | Fill #0

## 2017-05-09 MED FILL — HYDROCHLOROTHIAZIDE 25 MG T: 25 | 90 days supply | Qty: 90 | Fill #0

## 2017-05-15 ENCOUNTER — Other Ambulatory Visit: Payer: Self-pay | Admitting: Family Medicine

## 2017-05-15 DIAGNOSIS — R921 Mammographic calcification found on diagnostic imaging of breast: Secondary | ICD-10-CM

## 2017-05-15 DIAGNOSIS — R928 Other abnormal and inconclusive findings on diagnostic imaging of breast: Secondary | ICD-10-CM

## 2017-05-15 MED FILL — VENLAFAXINE HCL ER 150 MG C: 150 | 90 days supply | Qty: 90 | Fill #0

## 2017-05-18 ENCOUNTER — Ambulatory Visit
Admission: RE | Admit: 2017-05-18 | Discharge: 2017-05-18 | Disposition: A | Discharge status: Home / Self Care | Payer: 59 | Source: Ambulatory Visit | Attending: Family Medicine | Admitting: Family Medicine

## 2017-05-18 DIAGNOSIS — R922 Inconclusive mammogram: Secondary | ICD-10-CM | POA: Diagnosis not present

## 2017-05-18 DIAGNOSIS — R921 Mammographic calcification found on diagnostic imaging of breast: Secondary | ICD-10-CM | POA: Diagnosis not present

## 2017-05-18 DIAGNOSIS — R928 Other abnormal and inconclusive findings on diagnostic imaging of breast: Secondary | ICD-10-CM

## 2017-05-31 ENCOUNTER — Other Ambulatory Visit: Payer: Self-pay | Admitting: Family Medicine

## 2017-05-31 DIAGNOSIS — R921 Mammographic calcification found on diagnostic imaging of breast: Secondary | ICD-10-CM | POA: Diagnosis not present

## 2017-05-31 DIAGNOSIS — R928 Other abnormal and inconclusive findings on diagnostic imaging of breast: Secondary | ICD-10-CM

## 2017-06-14 ENCOUNTER — Ambulatory Visit
Admission: RE | Admit: 2017-06-14 | Discharge: 2017-06-14 | Disposition: A | Discharge status: Home / Self Care | Payer: 59 | Source: Ambulatory Visit | Attending: Family Medicine | Admitting: Family Medicine

## 2017-06-14 DIAGNOSIS — R921 Mammographic calcification found on diagnostic imaging of breast: Secondary | ICD-10-CM

## 2017-06-14 DIAGNOSIS — N6031 Fibrosclerosis of right breast: Secondary | ICD-10-CM | POA: Diagnosis not present

## 2017-06-14 DIAGNOSIS — R928 Other abnormal and inconclusive findings on diagnostic imaging of breast: Secondary | ICD-10-CM

## 2017-06-14 DIAGNOSIS — R920 Mammographic microcalcification found on diagnostic imaging of breast: Secondary | ICD-10-CM | POA: Diagnosis not present

## 2017-06-14 HISTORY — PX: BREAST BIOPSY: SHX20

## 2017-06-15 LAB — SURGICAL PATHOLOGY

## 2017-07-25 ENCOUNTER — Other Ambulatory Visit: Payer: Self-pay

## 2017-07-25 ENCOUNTER — Encounter (HOSPITAL_COMMUNITY): Payer: Self-pay | Admitting: Emergency Medicine

## 2017-07-25 ENCOUNTER — Ambulatory Visit (HOSPITAL_COMMUNITY)
Admission: EM | Admit: 2017-07-25 | Discharge: 2017-07-25 | Disposition: A | Discharge status: Home / Self Care | Payer: 59 | Attending: Family Medicine | Admitting: Family Medicine

## 2017-07-25 DIAGNOSIS — S51851A Open bite of right forearm, initial encounter: Secondary | ICD-10-CM

## 2017-07-25 DIAGNOSIS — W5501XA Bitten by cat, initial encounter: Secondary | ICD-10-CM

## 2017-07-25 MED ORDER — AMOXICILLIN-POT CLAVULANATE 875-125 MG PO TABS: 1.0000 | ORAL_TABLET | Freq: Two times a day (BID) | ORAL | 0 refills | Status: DC

## 2017-07-25 MED ORDER — FLUCONAZOLE 200 MG PO TABS: 200.0000 mg | ORAL_TABLET | Freq: Once | ORAL | 0 refills | Status: AC

## 2017-07-25 NOTE — ED Provider Notes (Signed)
[facility]    CSN: [id number] Arrival date & time: 07/25/17  1512     History   Chief Complaint No chief complaint on file.   HPI Helen Martinez is a 46 y.o. female.   Helen Martinez presents with complaints of redness and pain to right forearm after she was bit by her cat two days ago. It has become more red and painful. Rates pain 5/10. She is uptodate with her tetanus vaccine. Cat has had rabies vaccine. No known fevers. Without finger wrist or elbow pain. Has not taken any medications for her symptoms.   ROS per HPI.       Past Medical History:  Diagnosis Date  . Depression   . GERD (gastroesophageal reflux disease)   . Hypertension     Patient Active Problem List   Diagnosis Date Noted  . Porphyria cutanea tarda (HCC) 01/08/2017  . Anal fissure 06/02/2016  . Rectal pain 06/02/2016  . Loose stools 06/02/2016  . Contusion of lower back 09/18/2013    Past Surgical History:  Procedure Laterality Date  . ABDOMINAL HYSTERECTOMY    . BREAST BIOPSY Right 06/14/2017   path pending    OB History    No data available       Home Medications    Prior to Admission medications   Medication Sig Start Date End Date Taking? Authorizing Provider  Albuterol Sulfate (VENTOLIN HFA IN) Ventolin HFA 90 mcg/actuation aerosol inhaler   Yes [provider]  amLODipine (NORVASC) 10 MG tablet Take 10 mg by mouth daily.   Yes [provider]  hydrochlorothiazide (HYDRODIURIL) 25 MG tablet Take 1 tablet by mouth daily. 03/23/16  Yes [provider]  ibuprofen (ADVIL,MOTRIN) 200 MG tablet Take 800 mg by mouth every 6 (six) hours as needed for mild pain or moderate pain.   Yes [provider]  losartan (COZAAR) 50 MG tablet Take 1 tablet by mouth daily. 02/22/16  Yes [provider]  omeprazole (PRILOSEC OTC) 20 MG tablet Take 20 mg by mouth daily.   Yes [provider]  venlafaxine XR (EFFEXOR-XR) 150 MG 24 hr capsule Take  150 mg by mouth daily with breakfast.   Yes [provider]  amoxicillin-clavulanate (AUGMENTIN) 875-125 MG tablet Take 1 tablet by mouth every 12 (twelve) hours for 10 days. 07/25/17 08/04/17  Georgetta HaberBurky, Natalie B, NP  fluconazole (DIFLUCAN) 200 MG tablet Take 1 tablet (200 mg total) by mouth once for 1 dose. 07/25/17 07/25/17  Georgetta HaberBurky, Natalie B, NP    Family History Family History  Problem Relation Age of Onset  . Hypertension Mother   . Cancer Father        mouth then mets  . Breast cancer Maternal Aunt [phone]    Social History Social History   Tobacco Use  . Smoking status: Current Every Day Smoker    Packs/day: 1.00    Types: Cigarettes  . Smokeless tobacco: Never Used  Substance Use Topics  . Alcohol use: Yes    Comment: wine 2-3 times a week  . Drug use: No     Allergies   Lisinopril   Review of Systems Review of Systems   Physical Exam Triage Vital Signs ED Triage Vitals [07/25/17 1525]  Enc Vitals Group     BP (!) 145/86     Pulse Rate 89     Resp 18     Temp 99 F (37.2 C)     Temp Source Oral  SpO2 99 %     Weight      Height      Head Circumference      Peak Flow      Pain Score      Pain Loc      Pain Edu?      Excl. in GC?    No data found.  Updated Vital Signs BP (!) 145/86 (BP Location: Right Arm)   Pulse 89   Temp 99 F (37.2 C) (Oral)   Resp 18   SpO2 99%   Visual Acuity Right Eye Distance:   Left Eye Distance:   Bilateral Distance:    Right Eye Near:   Left Eye Near:    Bilateral Near:     Physical Exam  Constitutional: She is oriented to person, place, and time. She appears well-developed and well-nourished. No distress.  Cardiovascular: Normal rate, regular rhythm and normal heart sounds.  Pulmonary/Chest: Effort normal and breath sounds normal.  Neurological: She is alert and oriented to person, place, and time.  Skin: Skin is warm and dry.     Small puncture wound with sourrounding redness tenderness and  warmth to ulnar side of forearm; radial forearm with two scratches present without redness; see photo        UC Treatments / Results  Labs (all labs ordered are listed, but only abnormal results are displayed) Labs Reviewed - No data to display  EKG  EKG Interpretation None       Radiology No results found.  Procedures Procedures (including critical care time)  Medications Ordered in UC Medications - No data to display   Initial Impression / Assessment and Plan / UC Course  I have reviewed the triage vital signs and the nursing notes.  Pertinent labs & imaging results that were available during my care of the patient were reviewed by me and considered in my medical decision making (see chart for details).     Complete course of antibiotics. Return precautions provided. Ibuprofen, heath/ice application may be helpful. Diflucan for previous history of yeast infection s/p augmentin. If symptoms worsen or do not improve in the next week to return to be seen or to follow up with PCP. Patient verbalized understanding and agreeable to plan.    Final Clinical Impressions(s) / UC Diagnoses   Final diagnoses:  Cat bite of forearm, right, initial encounter    ED Discharge Orders        Ordered    amoxicillin-clavulanate (AUGMENTIN) 875-125 MG tablet  Every 12 hours     07/25/17 1532    fluconazole (DIFLUCAN) 200 MG tablet   Once     07/25/17 1532       Controlled Substance Prescriptions [place] Controlled Substance Registry consulted? Not Applicable   Georgetta HaberBurky, Natalie B, NP 07/25/17 1539

## 2017-07-25 NOTE — ED Triage Notes (Signed)
Her cat, shot record is current, bit in the rt arm, warm and swollen, sore, started Sunday night,

## 2017-07-25 NOTE — ED Notes (Signed)
Not available

## 2017-08-02 ENCOUNTER — Inpatient Hospital Stay: Payer: 59 | Attending: Oncology

## 2017-08-02 DIAGNOSIS — F17210 Nicotine dependence, cigarettes, uncomplicated: Secondary | ICD-10-CM | POA: Insufficient documentation

## 2017-08-02 DIAGNOSIS — K219 Gastro-esophageal reflux disease without esophagitis: Secondary | ICD-10-CM | POA: Diagnosis not present

## 2017-08-02 DIAGNOSIS — Z79899 Other long term (current) drug therapy: Secondary | ICD-10-CM | POA: Diagnosis not present

## 2017-08-02 DIAGNOSIS — I10 Essential (primary) hypertension: Secondary | ICD-10-CM | POA: Insufficient documentation

## 2017-08-02 DIAGNOSIS — F329 Major depressive disorder, single episode, unspecified: Secondary | ICD-10-CM | POA: Diagnosis not present

## 2017-08-02 LAB — CBC WITH DIFFERENTIAL/PLATELET
BASOS PCT: 1 %
Basophils Absolute: 0.1 10*3/uL (ref 0–0.1)
EOS ABS: 0.1 10*3/uL (ref 0–0.7)
Eosinophils Relative: 2 %
HCT: 40.9 % (ref 35.0–47.0)
Hemoglobin: 13.8 g/dL (ref 12.0–16.0)
Lymphocytes Relative: 26 %
Lymphs Abs: 1.9 10*3/uL (ref 1.0–3.6)
MCH: 30.6 pg (ref 26.0–34.0)
MCHC: 33.7 g/dL (ref 32.0–36.0)
MCV: 90.7 fL (ref 80.0–100.0)
MONOS PCT: 8 %
Monocytes Absolute: 0.6 10*3/uL (ref 0.2–0.9)
NEUTROS PCT: 63 %
Neutro Abs: 4.7 10*3/uL (ref 1.4–6.5)
PLATELETS: 230 10*3/uL (ref 150–440)
RBC: 4.51 MIL/uL (ref 3.80–5.20)
RDW: 17.3 % — AB (ref 11.5–14.5)
WBC: 7.3 10*3/uL (ref 3.6–11.0)

## 2017-08-02 LAB — FERRITIN: Ferritin: 37 ng/mL (ref 11–307)

## 2017-08-04 ENCOUNTER — Encounter: Payer: Self-pay | Admitting: Oncology

## 2017-08-04 ENCOUNTER — Inpatient Hospital Stay (HOSPITAL_BASED_OUTPATIENT_CLINIC_OR_DEPARTMENT_OTHER): Payer: 59 | Admitting: Oncology

## 2017-08-04 ENCOUNTER — Inpatient Hospital Stay: Payer: 59

## 2017-08-04 DIAGNOSIS — Z79899 Other long term (current) drug therapy: Secondary | ICD-10-CM | POA: Diagnosis not present

## 2017-08-04 DIAGNOSIS — I10 Essential (primary) hypertension: Secondary | ICD-10-CM | POA: Diagnosis not present

## 2017-08-04 DIAGNOSIS — K219 Gastro-esophageal reflux disease without esophagitis: Secondary | ICD-10-CM | POA: Diagnosis not present

## 2017-08-04 DIAGNOSIS — F329 Major depressive disorder, single episode, unspecified: Secondary | ICD-10-CM | POA: Diagnosis not present

## 2017-08-04 DIAGNOSIS — F17210 Nicotine dependence, cigarettes, uncomplicated: Secondary | ICD-10-CM | POA: Diagnosis not present

## 2017-08-04 NOTE — Progress Notes (Signed)
Patient here for follow up with lab results today.  She states that she is feeling well and denies having any pain.

## 2017-08-04 NOTE — Progress Notes (Signed)
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 She has not seen hematology before. Recent blood work from 10/27/2016 was as follows. CBC showed white count of 7.9, H&H of 14.8/42.3 with an MCV of 94 and a platelet count of 211. Iron studies showed iron saturation of 53% and ferritin of 559. Serum iron was elevated at 162. BMP was within normal limits. AST and ALT were mildly elevated at 45 and 47 respectively.  Patient states she was diagnosed with porphyria cutanea tarda about 20 something years ago around 1994 at that time she used to get recurrent blistering of her skin and she underwent 24-hour urine tests by a dermatologist who confirmed the diagnosis. This was in [place]. She was sent to primary care doctor to undergo phlebotomies but does not remember seeing the hematologist. Patient has not had any phlebotomies since 1994-95. Patient smokes about 1 pack of cigarettes per day and has been doing so over 20 years. She drinks about 2 glasses of wine every other day. She has never used birth control or estrogen supplements. She has not been tested for hepatitis in the past. Currently patient states that she gets blistering of her skin very occasionally but it has not been much of an acute issue. Denies any symptoms of nausea, vomiting, abdominal pain or neurological symptoms such as seizures. Her father died about 20 years ago and she does not remember much of her family history   Her further work up was as  follows:  cbc was normal. cmp showed mildly elevated AST of 44. ferritin was elevated at 414 and iron saturation was 33%. HIV, hep B and C testing was negative. 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 total plasma porphyrin elevated at 2.7 (upper limit of normal 1) but all the fractionated components were normal. quantitative urine porphobilinogen normal at 0.7. Free erythrocyte prophyrin and zinc protoporphyrin normal.   PORPHYRINS QUANTITATIVE URINE RANDOM  Uroporphyrins (UP) 311 [H ] ug/L BN  Reference Range: 0-20  Heptacarboxyl (7-CP) 163 [H ] ug/L BN  Reference Range: 0-2  Hexacarboxyl (6-CP) 3 [H ] ug/L BN  Reference Range: 0-1  Pentacarboxyl (5-CP) 122 [H ] ug/L BN  Reference Range: 0-2  Coproporphyrin (CP) I 135 [H ] ug/L BN  Reference Range: 0-15  Coproporphyrin (CP) III 27 ug/L BN  Reference Range: 0-49   Laboratory findings were consistent with porphyria cutanea tarda and patient was started on phlebotomy every 2 weeks.  Last phlebotomy was on 05/02/2017 when her ferritin was down to 10.  Interval history-reports that she is trying to quit smoking and is currently smoking less than 1 pack/day.  She does drink occasional glass of wine.  She is not taking any oral contraceptive pills.  Denies any skin blistering.  ECOG PS- 0 Pain scale- 0   Review of systems- Review of Systems  Constitutional: Negative for chills, fever, malaise/fatigue and weight loss.  HENT: Negative for congestion, ear discharge and nosebleeds.   Eyes: Negative for blurred vision.  Respiratory: Negative for cough, hemoptysis, sputum production, shortness of breath and wheezing.  Cardiovascular: Negative for chest pain, palpitations, orthopnea and claudication.  Gastrointestinal: Negative for abdominal pain, blood in stool, constipation, diarrhea, heartburn, melena, nausea and vomiting.  Genitourinary: Negative for dysuria, flank pain, frequency, hematuria and urgency.    Musculoskeletal: Negative for back pain, joint pain and myalgias.  Skin: Negative for rash.  Neurological: Negative for dizziness, tingling, focal weakness, seizures, weakness and headaches.  Endo/Heme/Allergies: Does not bruise/bleed easily.  Psychiatric/Behavioral: Negative for depression and suicidal ideas. The patient does not have insomnia.       Allergies  Allergen Reactions  . Lisinopril Cough     Past Medical History:  Diagnosis Date  . Depression   . GERD (gastroesophageal reflux disease)   . Hypertension      Past Surgical History:  Procedure Laterality Date  . ABDOMINAL HYSTERECTOMY    . BREAST BIOPSY Right 06/14/2017   path pending    Social History   Socioeconomic History  . Marital status: Single    Spouse name: Not on file  . Number of children: 2  . Years of education: Not on file  . Highest education level: Not on file  Social Needs  . Financial resource strain: Not on file  . Food insecurity - worry: Not on file  . Food insecurity - inability: Not on file  . Transportation needs - medical: Not on file  . Transportation needs - non-medical: Not on file  Occupational History  . Occupation: LPN  Tobacco Use  . Smoking status: Current Every Day Smoker    Packs/day: 1.00    Types: Cigarettes  . Smokeless tobacco: Never Used  Substance and Sexual Activity  . Alcohol use: Yes    Comment: wine 2-3 times a week  . Drug use: No  . Sexual activity: Yes    Birth control/protection: None  Other Topics Concern  . Not on file  Social History Narrative  . Not on file    Family History  Problem Relation Age of Onset  . Hypertension Mother   . Cancer Father        mouth then mets  . Breast cancer Maternal Aunt 54     Current Outpatient Medications:  .  Albuterol Sulfate (VENTOLIN HFA IN), Ventolin HFA 90 mcg/actuation aerosol inhaler, Disp: , Rfl:  .  amLODipine (NORVASC) 10 MG tablet, Take 10 mg by mouth daily., Disp: , Rfl:  .   amoxicillin-clavulanate (AUGMENTIN) 875-125 MG tablet, Take 1 tablet by mouth every 12 (twelve) hours for 10 days., Disp: 20 tablet, Rfl: 0 .  hydrochlorothiazide (HYDRODIURIL) 25 MG tablet, Take 1 tablet by mouth daily., Disp: , Rfl: 2 .  ibuprofen (ADVIL,MOTRIN) 200 MG tablet, Take 800 mg by mouth every 6 (six) hours as needed for mild pain or moderate pain., Disp: , Rfl:  .  losartan (COZAAR) 50 MG tablet, Take 1 tablet by mouth daily., Disp: , Rfl: 2 .  omeprazole (PRILOSEC OTC) 20 MG tablet, Take 20 mg by mouth daily., Disp: , Rfl:  .  venlafaxine XR (EFFEXOR-XR) 150 MG 24 hr capsule, Take 150 mg by mouth daily with breakfast., Disp: , Rfl:   Physical exam:  Vitals:   08/04/17 1407  BP: 129/81  Pulse: 88  Temp: 97.9 F (36.6 C)  TempSrc: Tympanic   Physical Exam  Constitutional: She is oriented to person, place, and time and well-developed, well-nourished, and in no distress.  HENT:  Head: Normocephalic and atraumatic.  Eyes: EOM are normal. Pupils are equal, round, and reactive to light.  Neck: Normal range of motion.  Cardiovascular: Normal rate, regular rhythm and normal heart sounds.  Pulmonary/Chest: Effort normal and breath sounds normal.  Abdominal: Soft. Bowel sounds are normal.  Neurological: She is alert and oriented to person, place, and time.  Skin: Skin is warm and dry.     CMP Latest Ref Rng & Units 11/10/2016  Glucose 65 - 99 mg/dL 161(W163(H)  BUN 6 - 20 mg/dL 14  Creatinine 9.600.44 - 4.541.00 mg/dL 0.980.60  Sodium 119135 - 147145 mmol/L 135  Potassium 3.5 - 5.1 mmol/L 3.4(L)  Chloride 101 - 111 mmol/L 102  CO2 22 - 32 mmol/L 23  Calcium 8.9 - 10.3 mg/dL 9.0  Total Protein 6.5 - 8.1 g/dL 7.3  Total Bilirubin 0.3 - 1.2 mg/dL 0.7  Alkaline Phos 38 - 126 U/L 39  AST 15 - 41 U/L 44(H)  ALT 14 - 54 U/L 41   CBC Latest Ref Rng & Units 08/02/2017  WBC 3.6 - 11.0 K/uL 7.3  Hemoglobin 12.0 - 16.0 g/dL 82.913.8  Hematocrit 56.235.0 - 47.0 % 40.9  Platelets 150 - 440 K/uL 230       Assessment and plan- Patient is a 46 y.o. female with porphyria cutanea tarda  CBC from 08/02/2017 was within normal limits.  Ferritin remains low at 37.  Patient is asymptomatic and does not report any skin blistering.  I will hold off on phlebotomy at this time.  I will repeat her CBC, CMP and ferritin in 3 months in 6 months and see her back in 6 months time.  Will reinitiate phlebotomy if ferritin is greater than 100  Visit Diagnosis 1. Porphyria cutanea tarda (HCC)      Dr. Owens SharkArchana Rao, MD, MPH [facility] Pager- [phone] 08/04/2017 2:25 PM

## 2017-08-08 MED FILL — LOSARTAN POTASSIUM 50 MG TA: 50 | 90 days supply | Qty: 90 | Fill #1

## 2017-08-08 MED FILL — HYDROCHLOROTHIAZIDE 25 MG T: 25 | 90 days supply | Qty: 90 | Fill #1

## 2017-08-08 MED FILL — AMLODIPINE BESYLATE 5 MG TA: 5 | 90 days supply | Qty: 90 | Fill #1

## 2017-08-08 MED FILL — VENLAFAXINE HCL ER 150 MG C: 150 | 90 days supply | Qty: 90 | Fill #1

## 2017-11-01 ENCOUNTER — Inpatient Hospital Stay: Payer: 59 | Attending: Oncology

## 2017-11-01 DIAGNOSIS — F329 Major depressive disorder, single episode, unspecified: Secondary | ICD-10-CM | POA: Insufficient documentation

## 2017-11-01 DIAGNOSIS — Z79899 Other long term (current) drug therapy: Secondary | ICD-10-CM | POA: Diagnosis not present

## 2017-11-01 DIAGNOSIS — K219 Gastro-esophageal reflux disease without esophagitis: Secondary | ICD-10-CM | POA: Diagnosis not present

## 2017-11-01 DIAGNOSIS — I10 Essential (primary) hypertension: Secondary | ICD-10-CM | POA: Diagnosis not present

## 2017-11-01 DIAGNOSIS — F17210 Nicotine dependence, cigarettes, uncomplicated: Secondary | ICD-10-CM | POA: Diagnosis not present

## 2017-11-01 LAB — CBC WITH DIFFERENTIAL/PLATELET
BASOS ABS: 0 10*3/uL (ref 0–0.1)
Basophils Relative: 1 %
EOS ABS: 0.1 10*3/uL (ref 0–0.7)
EOS PCT: 2 %
HCT: 40.5 % (ref 35.0–47.0)
HEMOGLOBIN: 14 g/dL (ref 12.0–16.0)
LYMPHS PCT: 29 %
Lymphs Abs: 2 10*3/uL (ref 1.0–3.6)
MCH: 32.1 pg (ref 26.0–34.0)
MCHC: 34.6 g/dL (ref 32.0–36.0)
MCV: 93 fL (ref 80.0–100.0)
Monocytes Absolute: 0.5 10*3/uL (ref 0.2–0.9)
Monocytes Relative: 7 %
NEUTROS PCT: 61 %
Neutro Abs: 4.3 10*3/uL (ref 1.4–6.5)
PLATELETS: 217 10*3/uL (ref 150–440)
RBC: 4.36 MIL/uL (ref 3.80–5.20)
RDW: 13.5 % (ref 11.5–14.5)
WBC: 6.9 10*3/uL (ref 3.6–11.0)

## 2017-11-01 LAB — FERRITIN: Ferritin: 42 ng/mL (ref 11–307)

## 2017-11-01 LAB — COMPREHENSIVE METABOLIC PANEL
ALK PHOS: 39 U/L (ref 38–126)
ALT: 34 U/L (ref 14–54)
AST: 40 U/L (ref 15–41)
Albumin: 3.8 g/dL (ref 3.5–5.0)
Anion gap: 9 (ref 5–15)
BUN: 11 mg/dL (ref 6–20)
CALCIUM: 9.2 mg/dL (ref 8.9–10.3)
CHLORIDE: 102 mmol/L (ref 101–111)
CO2: 23 mmol/L (ref 22–32)
Creatinine, Ser: 0.79 mg/dL (ref 0.44–1.00)
GFR calc non Af Amer: >60 mL/min (ref >60)
GLUCOSE: 175 mg/dL — AB (ref 65–99)
Potassium: 3.7 mmol/L (ref 3.5–5.1)
SODIUM: 134 mmol/L — AB (ref 135–145)
Total Bilirubin: 0.6 mg/dL (ref 0.3–1.2)
Total Protein: 7.3 g/dL (ref 6.5–8.1)

## 2017-11-09 ENCOUNTER — Ambulatory Visit (INDEPENDENT_AMBULATORY_CARE_PROVIDER_SITE_OTHER): Payer: Self-pay | Admitting: Family Medicine

## 2017-11-09 VITALS — BP 145/81 | HR 99 | Temp 98.6°F | Wt 180.0 lb

## 2017-11-09 DIAGNOSIS — R509 Fever, unspecified: Secondary | ICD-10-CM

## 2017-11-09 DIAGNOSIS — J019 Acute sinusitis, unspecified: Secondary | ICD-10-CM

## 2017-11-09 LAB — POCT INFLUENZA A/B
INFLUENZA A, POC: NEGATIVE
INFLUENZA B, POC: NEGATIVE

## 2017-11-09 MED ORDER — FLUCONAZOLE 150 MG PO TABS: 150.0000 mg | ORAL_TABLET | Freq: Once | ORAL | 0 refills | Status: AC

## 2017-11-09 MED ORDER — BENZONATATE 100 MG PO CAPS: 100.0000 mg | ORAL_CAPSULE | Freq: Three times a day (TID) | ORAL | 0 refills | Status: DC | PRN

## 2017-11-09 MED ORDER — AMOXICILLIN-POT CLAVULANATE 875-125 MG PO TABS: 1.0000 | ORAL_TABLET | Freq: Two times a day (BID) | ORAL | 0 refills | Status: DC

## 2017-11-09 NOTE — Progress Notes (Signed)
Patient ID: Helen Martinez, female    DOB: 2/95/284102/16/1972, 47 y.o.   MRN: [id number]  PCP: Toy CookeyHeadrick, Emily, FNP  Chief Complaint  Patient presents with  . choice-fever-cough sorethroat    Subjective:  HPI Helen Martinez is a 47 y.o. female presents for evaluation of evaluation of flulike symptoms.  Onset of symptoms occurred 4 days ago.  She has had intermittent fevers, sore throat, cough, thick greenish bloody-ish nasal mucus drainage, and persistent cough.  She is been negative of shortness of breath and or chest tightness.  She has received a flu immunization this year.  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  She has attempted relief with over-the-counter cough and flu preparation without significant relief. Social History   Socioeconomic History  . Marital status: Single    Spouse name: Not on file  . Number of children: 2  . Years of education: Not on file  . Highest education level: Not on file  Social Needs  . Financial resource strain: Not on file  . Food insecurity - worry: Not on file  . Food insecurity - inability: Not on file  . Transportation needs - medical: Not on file  . Transportation needs - non-medical: Not on file  Occupational History  . Occupation: LPN  Tobacco Use  . Smoking status: Current Every Day Smoker    Packs/day: 1.00    Types: Cigarettes  . Smokeless tobacco: Never Used  Substance and Sexual Activity  . Alcohol use: Yes    Comment: wine 2-3 times a week  . Drug use: No  . Sexual activity: Yes    Birth control/protection: None  Other Topics Concern  . Not on file  Social History Narrative  . Not on file    Family History  Problem Relation Age of Onset  . Hypertension Mother   . Cancer Father        mouth then mets  . Breast cancer Maternal Aunt 54   Review of Systems Pertinent negatives listed in HPI. Patient Active Problem List   Diagnosis Date Noted  . Porphyria cutanea tarda (HCC) 01/08/2017    . Anal fissure 06/02/2016  . Rectal pain 06/02/2016  . Loose stools 06/02/2016  . Contusion of lower back 09/18/2013    Allergies  Allergen Reactions  . Lisinopril Cough    Prior to Admission medications   Medication Sig Start Date End Date Taking? Authorizing Provider  Albuterol Sulfate (VENTOLIN HFA IN) Ventolin HFA 90 mcg/actuation aerosol inhaler   Yes [provider]  amLODipine (NORVASC) 10 MG tablet Take 10 mg by mouth daily.   Yes [provider]  hydrochlorothiazide (HYDRODIURIL) 25 MG tablet Take 1 tablet by mouth daily. 03/23/16  Yes [provider]  ibuprofen (ADVIL,MOTRIN) 200 MG tablet Take 800 mg by mouth every 6 (six) hours as needed for mild pain or moderate pain.   Yes [provider]  losartan (COZAAR) 50 MG tablet Take 1 tablet by mouth daily. 02/22/16  Yes [provider]  omeprazole (PRILOSEC OTC) 20 MG tablet Take 20 mg by mouth daily.   Yes [provider]  venlafaxine XR (EFFEXOR-XR) 150 MG 24 hr capsule Take 150 mg by mouth daily with breakfast.   Yes [provider]    Past Medical, Surgical Family and Social History reviewed and updated.    Objective:   Today's Vitals   11/09/17 0958  BP: (!) 145/81  Pulse: 99  Temp: 98.6 F (37 C)  SpO2: 96%  Weight: 180 lb (81.6 kg)    Wt Readings from Last 3 Encounters:  11/09/17 180 lb (81.6 kg)  05/04/17 178 lb (80.7 kg)  01/05/17 176 lb 2 oz (79.9 kg)    Physical Exam  Constitutional: She has a sickly appearance.  HENT:  Left Ear: A middle ear effusion is present.  Nose: Mucosal edema and rhinorrhea present. Right sinus exhibits maxillary sinus tenderness. Left sinus exhibits maxillary sinus tenderness.  Cardiovascular: Normal rate, regular rhythm, normal heart sounds and intact distal pulses.  Pulmonary/Chest: Effort normal and breath sounds normal.  Musculoskeletal: Normal range of motion.  Neurological: She is alert.  Skin: Skin is  warm.   Assessment & Plan:  1. Fever, unspecified fever cause, point-of-care influenza testing was negative today.  2.  Acute sinusitis, will treat empirically with Augmentin times 10 days.  Will also add a Diflucan in the event vaginal irritation develops.  For cough likely secondary to postnasal drip will treat with benzonatate 100-200 mg every 8 hours as needed for cough.   If symptoms worsen or do not improve please return for follow-up care or follow-up with PCP.   Godfrey Pick. Tiburcio Pea, MSN, FNP-C [address]  [place], [place] [address] [phone]

## 2017-11-09 NOTE — Patient Instructions (Signed)
Amoxicillin 875-125 mg every 1 tablet every 12 hours x 10 days.  Diflucan 150 mg 1 tablet.  Benzonatate 100-200 mg up to 3 times daily as needed.     Sinusitis, Adult Sinusitis is soreness and inflammation of your sinuses. Sinuses are hollow spaces in the bones around your face. They are located:  Around your eyes.  In the middle of your forehead.  Behind your nose.  In your cheekbones.  Your sinuses and nasal passages are lined with a stringy fluid (mucus). Mucus normally drains out of your sinuses. When your nasal tissues get inflamed or swollen, the mucus can get trapped or blocked so air cannot flow through your sinuses. This lets bacteria, viruses, and funguses grow, and that leads to infection. Follow these instructions at home: Medicines  Take, use, or apply over-the-counter and prescription medicines only as told by your doctor. These may include nasal sprays.  If you were prescribed an antibiotic medicine, take it as told by your doctor. Do not stop taking the antibiotic even if you start to feel better. Hydrate and Humidify  Drink enough water to keep your pee (urine) clear or pale yellow.  Use a cool mist humidifier to keep the humidity level in your home above 50%.  Breathe in steam for 10-15 minutes, 3-4 times a day or as told by your doctor. You can do this in the bathroom while a hot shower is running.  Try not to spend time in cool or dry air. Rest  Rest as much as possible.  Sleep with your head raised (elevated).  Make sure to get enough sleep each night. General instructions  Put a warm, moist washcloth on your face 3-4 times a day or as told by your doctor. This will help with discomfort.  Wash your hands often with soap and water. If there is no soap and water, use hand sanitizer.  Do not smoke. Avoid being around people who are smoking (secondhand smoke).  Keep all follow-up visits as told by your doctor. This is important. Contact a doctor  if:  You have a fever.  Your symptoms get worse.  Your symptoms do not get better within 10 days. Get help right away if:  You have a very bad headache.  You cannot stop throwing up (vomiting).  You have pain or swelling around your face or eyes.  You have trouble seeing.  You feel confused.  Your neck is stiff.  You have trouble breathing. This information is not intended to replace advice given to you by your health care provider. Make sure you discuss any questions you have with your health care provider. Document Released: 02/08/2008 Document Revised: 04/17/2016 Document Reviewed: 06/17/2015 Elsevier Interactive Patient Education  [employer]2018 [employer]

## 2017-11-10 MED FILL — VENLAFAXINE HCL ER 150 MG C: 150 | 90 days supply | Qty: 90 | Fill #2

## 2017-11-10 MED FILL — LOSARTAN POTASSIUM 50 MG TA: 50 | 90 days supply | Qty: 90 | Fill #0

## 2017-11-10 MED FILL — HYDROCHLOROTHIAZIDE 25 MG T: 25 | 90 days supply | Qty: 90 | Fill #0

## 2017-11-10 MED FILL — AMLODIPINE BESYLATE 5 MG TA: 5 | 90 days supply | Qty: 90 | Fill #0

## 2017-11-13 ENCOUNTER — Telehealth: Payer: Self-pay | Admitting: Emergency Medicine

## 2017-11-13 NOTE — Telephone Encounter (Signed)
Left message follow up call for visit with [facility] 

## 2017-11-16 ENCOUNTER — Telehealth: Payer: Self-pay | Admitting: Emergency Medicine

## 2017-11-16 ENCOUNTER — Encounter: Payer: Self-pay | Admitting: Emergency Medicine

## 2017-11-16 NOTE — Telephone Encounter (Signed)
Patient contacted office requesting a work not for date of service 11/09/2017 stating that she was seen in our office. Requested to be fax over to [phone]

## 2017-11-22 ENCOUNTER — Other Ambulatory Visit: Payer: Self-pay | Admitting: Family Medicine

## 2017-12-11 ENCOUNTER — Other Ambulatory Visit: Payer: Self-pay | Admitting: Family Medicine

## 2017-12-11 DIAGNOSIS — Z87898 Personal history of other specified conditions: Secondary | ICD-10-CM

## 2018-01-05 ENCOUNTER — Ambulatory Visit (INDEPENDENT_AMBULATORY_CARE_PROVIDER_SITE_OTHER): Payer: Self-pay | Admitting: Family Medicine

## 2018-01-05 VITALS — BP 132/90 | HR 89 | Temp 99.3°F | Resp 18 | Wt 181.2 lb

## 2018-01-05 DIAGNOSIS — J40 Bronchitis, not specified as acute or chronic: Secondary | ICD-10-CM

## 2018-01-05 MED ORDER — DOXYCYCLINE HYCLATE 100 MG PO TABS: 100.0000 mg | ORAL_TABLET | Freq: Two times a day (BID) | ORAL | 0 refills | Status: DC

## 2018-01-05 MED ORDER — IPRATROPIUM BROMIDE 0.06 % NA SOLN: 2.0000 | Freq: Three times a day (TID) | NASAL | 0 refills | Status: DC

## 2018-01-05 MED ORDER — MONTELUKAST SODIUM 10 MG PO TABS: 10.0000 mg | ORAL_TABLET | Freq: Every day | ORAL | 0 refills | Status: DC

## 2018-01-05 MED ORDER — ALBUTEROL SULFATE HFA 108 (90 BASE) MCG/ACT IN AERS: 2.0000 | INHALATION_SPRAY | Freq: Four times a day (QID) | RESPIRATORY_TRACT | 0 refills | Status: DC | PRN

## 2018-01-05 MED FILL — DOXYCYCLINE HYCLATE 100 MG: 100 | 10 days supply | Qty: 20 | Fill #0

## 2018-01-05 MED FILL — VENTOLIN HFA 90 MCG INHALER: 108 (90 BAS | 25 days supply | Qty: 18 | Fill #0

## 2018-01-05 MED FILL — MONTELUKAST SOD 10 MG TAB: 10 | 14 days supply | Qty: 14 | Fill #0

## 2018-01-05 MED FILL — IPRATROPIUM 0.06% SPRAY: 0.06 | 14 days supply | Qty: 15 | Fill #0

## 2018-01-05 NOTE — Patient Instructions (Signed)

## 2018-01-05 NOTE — Progress Notes (Signed)
Helen Martinez is a 47 y.o. female who presents today with concerns of cough congestion and sore throat for the last 5 days.  She reports a known smoking history.  She denies any recent sick contacts.  She has chronic condition of hypertension and has attempted over-the-counter pseudoephedrine for current symptoms without much relief.   Review of Systems  Constitutional: Positive for chills, fever and malaise/fatigue.  HENT: Positive for congestion and sore throat. Negative for ear discharge, ear pain and sinus pain.   Eyes: Negative.   Respiratory: Positive for cough and sputum production. Negative for shortness of breath.   Cardiovascular: Negative.  Negative for chest pain.  Gastrointestinal: Negative for abdominal pain, diarrhea, nausea and vomiting.  Genitourinary: Negative for dysuria, frequency, hematuria and urgency.  Musculoskeletal: Negative for myalgias.  Skin: Negative.   Neurological: Negative for headaches.  Endo/Heme/Allergies: Negative.   Psychiatric/Behavioral: Negative.     O: Vitals:   01/05/18 1157  BP: 132/90  Pulse: 89  Resp: 18  Temp: 99.3 F (37.4 C)  SpO2: 98%     Physical Exam  Constitutional: She is oriented to person, place, and time. She appears well-developed and well-nourished. She is active.  Non-toxic appearance. She does not have a sickly appearance.  HENT:  Head: Normocephalic.  Right Ear: Hearing, tympanic membrane, external ear and ear canal normal.  Left Ear: Hearing, tympanic membrane, external ear and ear canal normal.  Nose: Mucosal edema and rhinorrhea present.  Mouth/Throat: Uvula is midline. Posterior oropharyngeal erythema present.  Neck: Normal range of motion. Neck supple.  Cardiovascular: Normal rate, regular rhythm, normal heart sounds and normal pulses.  Pulmonary/Chest: Effort normal and breath sounds normal. She has no decreased breath sounds.  Frequent dry cough during exam  Abdominal: Soft. Bowel sounds are normal.   Musculoskeletal: Normal range of motion.  Lymphadenopathy:       Head (right side): No submental and no submandibular adenopathy present.       Head (left side): No submental and no submandibular adenopathy present.    She has cervical adenopathy.  Neurological: She is alert and oriented to person, place, and time.  Psychiatric: She has a normal mood and affect.  Vitals reviewed.   A: 1. Bronchitis      P: Work note provided...  Exam findings, diagnosis etiology and medication use and indications reviewed with patient. Follow- Up and discharge instructions provided. No emergent/urgent issues found on exam.  Patient verbalized understanding of information provided and agrees with plan of care (POC), all questions answered.  1. Bronchitis - montelukast (SINGULAIR) 10 MG tablet; Take 1 tablet (10 mg total) by mouth at bedtime. - albuterol (PROVENTIL HFA;VENTOLIN HFA) 108 (90 Base) MCG/ACT inhaler; Inhale 2 puffs into the lungs every 6 (six) hours as needed for wheezing or shortness of breath. - doxycycline (VIBRA-TABS) 100 MG tablet; Take 1 tablet (100 mg total) by mouth 2 (two) times daily. - ipratropium (ATROVENT) 0.06 % nasal spray; Place 2 sprays into both nostrils 3 (three) times daily.

## 2018-01-08 ENCOUNTER — Telehealth: Payer: Self-pay

## 2018-01-08 NOTE — Telephone Encounter (Signed)
I left a message to the patient asking to call us back. 

## 2018-01-11 ENCOUNTER — Ambulatory Visit
Admission: RE | Admit: 2018-01-11 | Discharge: 2018-01-11 | Disposition: A | Discharge status: Home / Self Care | Payer: 59 | Source: Ambulatory Visit | Attending: Family Medicine | Admitting: Family Medicine

## 2018-01-11 DIAGNOSIS — R921 Mammographic calcification found on diagnostic imaging of breast: Secondary | ICD-10-CM | POA: Insufficient documentation

## 2018-01-11 DIAGNOSIS — Z87898 Personal history of other specified conditions: Secondary | ICD-10-CM | POA: Diagnosis not present

## 2018-01-16 ENCOUNTER — Encounter: Payer: Self-pay | Admitting: Family

## 2018-01-16 ENCOUNTER — Ambulatory Visit (INDEPENDENT_AMBULATORY_CARE_PROVIDER_SITE_OTHER): Payer: Self-pay | Admitting: Family

## 2018-01-16 VITALS — BP 124/82 | HR 104 | Temp 98.9°F | Resp 17 | Wt 181.2 lb

## 2018-01-16 DIAGNOSIS — J208 Acute bronchitis due to other specified organisms: Secondary | ICD-10-CM

## 2018-01-16 MED ORDER — PREDNISONE 20 MG PO TABS: 20.0000 mg | ORAL_TABLET | Freq: Every day | ORAL | 0 refills | Status: DC

## 2018-01-16 MED FILL — predniSONE 20 MG TABS: 20 | 9 days supply | Qty: 18 | Fill #0

## 2018-01-16 NOTE — Patient Instructions (Signed)

## 2018-01-16 NOTE — Progress Notes (Signed)
Subjective:     Patient ID: Helen Martinez, female   DOB: [birth year], 47 y.o.   MRN: [id number]  HPI 47 year old female is in today after being seen on 01/05/18 with Bronchitis. She was treated with and inhaler, singulair and doxycycline. She felt she was getting better but now feels worse. Has more wheezing, a headache, and feels tired  Review of Systems  Constitutional: Positive for fatigue. Negative for chills and fever.  HENT: Positive for congestion and postnasal drip.   Eyes: Negative.   Respiratory: Positive for cough and wheezing. Negative for shortness of breath.   Cardiovascular: Negative.   Endocrine: Negative.   Musculoskeletal: Negative.   Allergic/Immunologic: Negative.   Neurological: Negative.   Psychiatric/Behavioral: Negative.    Past Medical History:  Diagnosis Date  . Depression   . GERD (gastroesophageal reflux disease)   . Hypertension     Social History   Socioeconomic History  . Marital status: Single    Spouse name: Not on file  . Number of children: 2  . Years of education: Not on file  . Highest education level: Not on file  Occupational History  . Occupation: LPN  Social Needs  . Financial resource strain: Not on file  . Food insecurity:    Worry: Not on file    Inability: Not on file  . Transportation needs:    Medical: Not on file    Non-medical: Not on file  Tobacco Use  . Smoking status: Current Every Day Smoker    Packs/day: 1.00    Types: Cigarettes  . Smokeless tobacco: Never Used  Substance and Sexual Activity  . Alcohol use: Yes    Comment: wine 2-3 times a week  . Drug use: No  . Sexual activity: Yes    Birth control/protection: None  Lifestyle  . Physical activity:    Days per week: Not on file    Minutes per session: Not on file  . Stress: Not on file  Relationships  . Social connections:    Talks on phone: Not on file    Gets together: Not on file    Attends religious service: Not on file    Active member of club  or organization: Not on file    Attends meetings of clubs or organizations: Not on file    Relationship status: Not on file  . Intimate partner violence:    Fear of current or ex partner: Not on file    Emotionally abused: Not on file    Physically abused: Not on file    Forced sexual activity: Not on file  Other Topics Concern  . Not on file  Social History Narrative  . Not on file    Past Surgical History:  Procedure Laterality Date  . ABDOMINAL HYSTERECTOMY    . BREAST BIOPSY Right 06/14/2017   Benign  . OOPHORECTOMY      Family History  Problem Relation Age of Onset  . Hypertension Mother   . Cancer Father        mouth then mets  . Breast cancer Maternal Aunt 54    Allergies  Allergen Reactions  . Lisinopril Cough    Current Outpatient Medications on File Prior to Visit  Medication Sig Dispense Refill  . albuterol (PROVENTIL HFA;VENTOLIN HFA) 108 (90 Base) MCG/ACT inhaler Inhale 2 puffs into the lungs every 6 (six) hours as needed for wheezing or shortness of breath. 1 Inhaler 0  . Albuterol Sulfate (VENTOLIN HFA IN) Ventolin HFA  90 mcg/actuation aerosol inhaler    . amLODipine (NORVASC) 10 MG tablet Take 10 mg by mouth daily.    Marland Kitchen amoxicillin-clavulanate (AUGMENTIN) 875-125 MG tablet Take 1 tablet by mouth 2 (two) times daily. 20 tablet 0  . benzonatate (TESSALON) 100 MG capsule Take 1-2 capsules (100-200 mg total) by mouth 3 (three) times daily as needed for cough. 40 capsule 0  . doxycycline (VIBRA-TABS) 100 MG tablet Take 1 tablet (100 mg total) by mouth 2 (two) times daily. 20 tablet 0  . hydrochlorothiazide (HYDRODIURIL) 25 MG tablet Take 1 tablet by mouth daily.  2  . ibuprofen (ADVIL,MOTRIN) 200 MG tablet Take 800 mg by mouth every 6 (six) hours as needed for mild pain or moderate pain.    Marland Kitchen ipratropium (ATROVENT) 0.06 % nasal spray Place 2 sprays into both nostrils 3 (three) times daily. 15 mL 0  . losartan (COZAAR) 50 MG tablet Take 1 tablet by mouth daily.   2  . montelukast (SINGULAIR) 10 MG tablet Take 1 tablet (10 mg total) by mouth at bedtime. 14 tablet 0  . omeprazole (PRILOSEC OTC) 20 MG tablet Take 20 mg by mouth daily.    Marland Kitchen venlafaxine XR (EFFEXOR-XR) 150 MG 24 hr capsule Take 150 mg by mouth daily with breakfast.     No current facility-administered medications on file prior to visit.     BP 124/82 (BP Location: Right Arm, Patient Position: Sitting, Cuff Size: Normal)   Pulse (!) 104   Temp 98.9 F (37.2 C) (Oral)   Resp 17   Wt 181 lb 3.2 oz (82.2 kg)   SpO2 98%   BMI 32.11 kg/m chart    Objective:   Physical Exam  Constitutional: She is oriented to person, place, and time. She appears well-developed and well-nourished.  HENT:  Head: Normocephalic.  Neck: Normal range of motion.  Cardiovascular: Normal rate, regular rhythm and normal heart sounds.  Pulmonary/Chest: Effort normal. She has wheezes. She exhibits no tenderness.  Musculoskeletal: Normal range of motion.  Neurological: She is alert and oriented to person, place, and time.  Skin: Skin is warm and dry.   Past Medical History:  Diagnosis Date  . Depression   . GERD (gastroesophageal reflux disease)   . Hypertension     Social History   Socioeconomic History  . Marital status: Single    Spouse name: Not on file  . Number of children: 2  . Years of education: Not on file  . Highest education level: Not on file  Occupational History  . Occupation: LPN  Social Needs  . Financial resource strain: Not on file  . Food insecurity:    Worry: Not on file    Inability: Not on file  . Transportation needs:    Medical: Not on file    Non-medical: Not on file  Tobacco Use  . Smoking status: Current Every Day Smoker    Packs/day: 1.00    Types: Cigarettes  . Smokeless tobacco: Never Used  Substance and Sexual Activity  . Alcohol use: Yes    Comment: wine 2-3 times a week  . Drug use: No  . Sexual activity: Yes    Birth control/protection: None   Lifestyle  . Physical activity:    Days per week: Not on file    Minutes per session: Not on file  . Stress: Not on file  Relationships  . Social connections:    Talks on phone: Not on file    Gets together: Not  on file    Attends religious service: Not on file    Active member of club or organization: Not on file    Attends meetings of clubs or organizations: Not on file    Relationship status: Not on file  . Intimate partner violence:    Fear of current or ex partner: Not on file    Emotionally abused: Not on file    Physically abused: Not on file    Forced sexual activity: Not on file  Other Topics Concern  . Not on file  Social History Narrative  . Not on file    Past Surgical History:  Procedure Laterality Date  . ABDOMINAL HYSTERECTOMY    . BREAST BIOPSY Right 06/14/2017   Benign  . OOPHORECTOMY      Family History  Problem Relation Age of Onset  . Hypertension Mother   . Cancer Father        mouth then mets  . Breast cancer Maternal Aunt 54    Allergies  Allergen Reactions  . Lisinopril Cough    Current Outpatient Medications on File Prior to Visit  Medication Sig Dispense Refill  . albuterol (PROVENTIL HFA;VENTOLIN HFA) 108 (90 Base) MCG/ACT inhaler Inhale 2 puffs into the lungs every 6 (six) hours as needed for wheezing or shortness of breath. 1 Inhaler 0  . Albuterol Sulfate (VENTOLIN HFA IN) Ventolin HFA 90 mcg/actuation aerosol inhaler    . amLODipine (NORVASC) 10 MG tablet Take 10 mg by mouth daily.    Marland Kitchen amoxicillin-clavulanate (AUGMENTIN) 875-125 MG tablet Take 1 tablet by mouth 2 (two) times daily. 20 tablet 0  . benzonatate (TESSALON) 100 MG capsule Take 1-2 capsules (100-200 mg total) by mouth 3 (three) times daily as needed for cough. 40 capsule 0  . doxycycline (VIBRA-TABS) 100 MG tablet Take 1 tablet (100 mg total) by mouth 2 (two) times daily. 20 tablet 0  . hydrochlorothiazide (HYDRODIURIL) 25 MG tablet Take 1 tablet by mouth daily.  2  .  ibuprofen (ADVIL,MOTRIN) 200 MG tablet Take 800 mg by mouth every 6 (six) hours as needed for mild pain or moderate pain.    Marland Kitchen ipratropium (ATROVENT) 0.06 % nasal spray Place 2 sprays into both nostrils 3 (three) times daily. 15 mL 0  . losartan (COZAAR) 50 MG tablet Take 1 tablet by mouth daily.  2  . montelukast (SINGULAIR) 10 MG tablet Take 1 tablet (10 mg total) by mouth at bedtime. 14 tablet 0  . omeprazole (PRILOSEC OTC) 20 MG tablet Take 20 mg by mouth daily.    Marland Kitchen venlafaxine XR (EFFEXOR-XR) 150 MG 24 hr capsule Take 150 mg by mouth daily with breakfast.     No current facility-administered medications on file prior to visit.     BP 124/82 (BP Location: Right Arm, Patient Position: Sitting, Cuff Size: Normal)   Pulse (!) 104   Temp 98.9 F (37.2 C) (Oral)   Resp 17   Wt 181 lb 3.2 oz (82.2 kg)   SpO2 98%   BMI 32.11 kg/m chart    Assessment:     Helen Martinez was seen today for cough,fever,sore throat.  Diagnoses and all orders for this visit:  Acute bronchitis due to other specified organisms  Other orders -     predniSONE (DELTASONE) 20 MG tablet; Take 1 tablet (20 mg total) by mouth daily with breakfast. 60 mg po qam x 3 days,  po qam x 3 days, 20 mg po qam x 3 days  Plan:     Continue Tessalon Perles. Rest. Drink Plenty of fluids and call if symptoms worsen or persist.

## 2018-01-31 ENCOUNTER — Inpatient Hospital Stay: Payer: 59 | Attending: Oncology

## 2018-01-31 ENCOUNTER — Other Ambulatory Visit: Payer: Self-pay

## 2018-01-31 DIAGNOSIS — I10 Essential (primary) hypertension: Secondary | ICD-10-CM | POA: Diagnosis not present

## 2018-01-31 DIAGNOSIS — F17210 Nicotine dependence, cigarettes, uncomplicated: Secondary | ICD-10-CM | POA: Diagnosis not present

## 2018-01-31 DIAGNOSIS — F329 Major depressive disorder, single episode, unspecified: Secondary | ICD-10-CM | POA: Diagnosis not present

## 2018-01-31 DIAGNOSIS — Z79899 Other long term (current) drug therapy: Secondary | ICD-10-CM | POA: Insufficient documentation

## 2018-01-31 DIAGNOSIS — K219 Gastro-esophageal reflux disease without esophagitis: Secondary | ICD-10-CM | POA: Insufficient documentation

## 2018-01-31 LAB — CBC WITH DIFFERENTIAL/PLATELET
BASOS ABS: 0.1 10*3/uL (ref 0–0.1)
Basophils Relative: 1 %
EOS ABS: 0.1 10*3/uL (ref 0–0.7)
EOS PCT: 2 %
HCT: 40.7 % (ref 35.0–47.0)
Hemoglobin: 14.1 g/dL (ref 12.0–16.0)
Lymphocytes Relative: 26 %
Lymphs Abs: 1.8 10*3/uL (ref 1.0–3.6)
MCH: 32.5 pg (ref 26.0–34.0)
MCHC: 34.6 g/dL (ref 32.0–36.0)
MCV: 94 fL (ref 80.0–100.0)
Monocytes Absolute: 0.6 10*3/uL (ref 0.2–0.9)
Monocytes Relative: 8 %
NEUTROS PCT: 63 %
Neutro Abs: 4.2 10*3/uL (ref 1.4–6.5)
PLATELETS: 227 10*3/uL (ref 150–440)
RBC: 4.33 MIL/uL (ref 3.80–5.20)
RDW: 13.3 % (ref 11.5–14.5)
WBC: 6.7 10*3/uL (ref 3.6–11.0)

## 2018-01-31 LAB — COMPREHENSIVE METABOLIC PANEL
ALT: 34 U/L (ref 14–54)
AST: 43 U/L — AB (ref 15–41)
Albumin: 4 g/dL (ref 3.5–5.0)
Alkaline Phosphatase: 35 U/L — ABNORMAL LOW (ref 38–126)
Anion gap: 10 (ref 5–15)
BILIRUBIN TOTAL: 0.4 mg/dL (ref 0.3–1.2)
BUN: 13 mg/dL (ref 6–20)
CO2: 25 mmol/L (ref 22–32)
CREATININE: 0.61 mg/dL (ref 0.44–1.00)
Calcium: 9.3 mg/dL (ref 8.9–10.3)
Chloride: 102 mmol/L (ref 101–111)
Glucose, Bld: 146 mg/dL — ABNORMAL HIGH (ref 65–99)
Potassium: 3.9 mmol/L (ref 3.5–5.1)
Sodium: 137 mmol/L (ref 135–145)
TOTAL PROTEIN: 7.2 g/dL (ref 6.5–8.1)

## 2018-01-31 LAB — FERRITIN: Ferritin: 62 ng/mL (ref 11–307)

## 2018-02-02 ENCOUNTER — Encounter: Payer: Self-pay | Admitting: Nurse Practitioner

## 2018-02-02 ENCOUNTER — Inpatient Hospital Stay: Payer: 59

## 2018-02-02 ENCOUNTER — Inpatient Hospital Stay (HOSPITAL_BASED_OUTPATIENT_CLINIC_OR_DEPARTMENT_OTHER): Payer: 59 | Admitting: Nurse Practitioner

## 2018-02-02 ENCOUNTER — Other Ambulatory Visit: Payer: 59

## 2018-02-02 DIAGNOSIS — I10 Essential (primary) hypertension: Secondary | ICD-10-CM | POA: Diagnosis not present

## 2018-02-02 DIAGNOSIS — K219 Gastro-esophageal reflux disease without esophagitis: Secondary | ICD-10-CM | POA: Diagnosis not present

## 2018-02-02 DIAGNOSIS — Z79899 Other long term (current) drug therapy: Secondary | ICD-10-CM | POA: Diagnosis not present

## 2018-02-02 DIAGNOSIS — F329 Major depressive disorder, single episode, unspecified: Secondary | ICD-10-CM | POA: Diagnosis not present

## 2018-02-02 DIAGNOSIS — F17210 Nicotine dependence, cigarettes, uncomplicated: Secondary | ICD-10-CM | POA: Diagnosis not present

## 2018-02-02 NOTE — Progress Notes (Signed)
Hematology/Oncology Consult note [facility]  Telephone:([phone] [phone]  Patient Care Team: Toy Cookey, FNP as PCP - General (Family Medicine)   Name of the patient: Helen Martinez  [id number]  [birth year]   Date of visit: 02/02/18  Diagnosis- porphyria cutanea tarda  Chief complaint/ Reason for visit- 6 month f/u of porphyria cutanea tarda  Heme/Onc history: Initially patient presented as 47 year old female with PMH significant for hptn and porphyria cutaneous tarda. She had not seen hematology prior. Bloodwork from 10/27/16: CBC showed white count of 7.9, H&H of 14.8/42.3 with an MCV of 94 and a platelet count of 211. Iron studies showed iron saturation of 53% and ferritin of 559. Serum iron was elevated at 162. BMP was within normal limits. AST and ALT were mildly elevated at 45 and 47 respectively.  Patient stateed she was diagnosed with porphyria cutanea tarda about 20 something years ago around 1994 at that time she used to get recurrent blistering of her skin and she underwent 24-hour urine tests by a dermatologist who confirmed the diagnosis. This was in [place]. She was sent to primary care doctor to undergo phlebotomies but did not remember seeing the hematologist. Patient has not had any phlebotomies since 1994-95. Patient smokes about 1 pack of cigarettes per day and has been doing so over 20 years. She drinks about 2 glasses of wine every other day. She has never used birth control or estrogen supplements. She had not been tested for hepatitis in the past. Currently patient states that she gets blistering of her skin very occasionally but it has not been much of an acute issue. Denies any symptoms of nausea, vomiting, abdominal pain or neurological symptoms such as seizures. Her father died about 20 years ago and she does not remember much of her family history  Further work-up revealed:  CBC- normal; CMP- mildly elevated AST of 44.  ferritin was elevated at 414 and iron saturation was 33%. HIV, hep B and C testing was negative. 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 total plasma porphyrin elevated at 2.7 (upper limit of normal 1) but all the fractionated components were normal. quantitative urine porphobilinogen normal at 0.7. Free erythrocyte prophyrin and zinc protoporphyrin normal.   PORPHYRINS QUANTITATIVE URINE RANDOM  Uroporphyrins (UP) 311 [H ] ug/L BN  Reference Range: 0-20  Heptacarboxyl (7-CP) 163 [H ] ug/L BN  Reference Range: 0-2  Hexacarboxyl (6-CP) 3 [H ] ug/L BN  Reference Range: 0-1  Pentacarboxyl (5-CP) 122 [H ] ug/L BN  Reference Range: 0-2  Coproporphyrin (CP) I 135 [H ] ug/L BN  Reference Range: 0-15  Coproporphyrin (CP) III 27 ug/L BN  Reference Range: 0-49   Laboratory findings were consistent with porphyria cutanea tarda and patient was started on phlebotomy every 2 weeks at that time. Phlebotomy on 05/02/2017 showed ferritin down to 10.  Interval history- Patient returns to clinic today for follow-up. Unfortunately she continues to smoke but tries to cut down. She drinks wine a few times a week. She does not take oral contraceptive pills. She denies any recent blistering of the skin.   ECOG PS- 0 Pain scale- 0  Review of systems- Review of Systems  Constitutional: Negative for chills, fever, malaise/fatigue and weight loss.  HENT: Negative for congestion, ear discharge and nosebleeds.   Eyes: Negative for blurred vision.  Respiratory: Negative for cough, hemoptysis, sputum production, shortness of breath and wheezing.   Cardiovascular: Negative for chest pain, palpitations, orthopnea and  claudication.  Gastrointestinal: Negative for abdominal pain, blood in stool, constipation, diarrhea, heartburn, melena, nausea and vomiting.  Genitourinary: Negative for dysuria, flank pain, frequency, hematuria and urgency.  Musculoskeletal: Negative for back pain, joint pain and  myalgias.  Skin: Negative for rash.  Neurological: Negative for dizziness, tingling, focal weakness, seizures, weakness and headaches.  Endo/Heme/Allergies: Does not bruise/bleed easily.  Psychiatric/Behavioral: Negative for depression and suicidal ideas. The patient does not have insomnia.     Allergies  Allergen Reactions  . Lisinopril Cough    Past Medical History:  Diagnosis Date  . Depression   . GERD (gastroesophageal reflux disease)   . Hypertension     Past Surgical History:  Procedure Laterality Date  . ABDOMINAL HYSTERECTOMY    . BREAST BIOPSY Right 06/14/2017   Benign  . OOPHORECTOMY      Social History   Socioeconomic History  . Marital status: Single    Spouse name: Not on file  . Number of children: 2  . Years of education: Not on file  . Highest education level: Not on file  Occupational History  . Occupation: LPN  Social Needs  . Financial resource strain: Not on file  . Food insecurity:    Worry: Not on file    Inability: Not on file  . Transportation needs:    Medical: Not on file    Non-medical: Not on file  Tobacco Use  . Smoking status: Current Every Day Smoker    Packs/day: 1.00    Types: Cigarettes  . Smokeless tobacco: Never Used  Substance and Sexual Activity  . Alcohol use: Yes    Comment: wine 2-3 times a week  . Drug use: No  . Sexual activity: Yes    Birth control/protection: None  Lifestyle  . Physical activity:    Days per week: Not on file    Minutes per session: Not on file  . Stress: Not on file  Relationships  . Social connections:    Talks on phone: Not on file    Gets together: Not on file    Attends religious service: Not on file    Active member of club or organization: Not on file    Attends meetings of clubs or organizations: Not on file    Relationship status: Not on file  . Intimate partner violence:    Fear of current or ex partner: Not on file    Emotionally abused: Not on file    Physically abused:  Not on file    Forced sexual activity: Not on file  Other Topics Concern  . Not on file  Social History Narrative  . Not on file    Family History  Problem Relation Age of Onset  . Hypertension Mother   . Cancer Father        mouth then mets  . Breast cancer Maternal Aunt 54     Current Outpatient Medications:  .  albuterol (PROVENTIL HFA;VENTOLIN HFA) 108 (90 Base) MCG/ACT inhaler, Inhale 2 puffs into the lungs every 6 (six) hours as needed for wheezing or shortness of breath., Disp: 1 Inhaler, Rfl: 0 .  amLODipine (NORVASC) 10 MG tablet, Take 10 mg by mouth daily., Disp: , Rfl:  .  hydrochlorothiazide (HYDRODIURIL) 25 MG tablet, Take 1 tablet by mouth daily., Disp: , Rfl: 2 .  ibuprofen (ADVIL,MOTRIN) 200 MG tablet, Take 800 mg by mouth every 6 (six) hours as needed for mild pain or moderate pain., Disp: , Rfl:  .  ipratropium (ATROVENT) 0.06 % nasal spray, Place 2 sprays into both nostrils 3 (three) times daily., Disp: 15 mL, Rfl: 0 .  losartan (COZAAR) 50 MG tablet, Take 1 tablet by mouth daily., Disp: , Rfl: 2 .  omeprazole (PRILOSEC OTC) 20 MG tablet, Take 20 mg by mouth daily., Disp: , Rfl:  .  venlafaxine XR (EFFEXOR-XR) 150 MG 24 hr capsule, Take 150 mg by mouth daily with breakfast., Disp: , Rfl:   Physical exam:  Vitals:   02/02/18 1357  BP: 127/83  Pulse: (!) 106  Resp: 18  Temp: 98.9 F (37.2 C)  TempSrc: Tympanic  Weight: 181 lb (82.1 kg)   Physical Exam  Constitutional: She is oriented to person, place, and time and well-developed, well-nourished, and in no distress.  HENT:  Head: Normocephalic and atraumatic.  Eyes: Pupils are equal, round, and reactive to light. EOM are normal.  Neck: Normal range of motion.  Cardiovascular: Normal rate, regular rhythm and normal heart sounds.  Pulmonary/Chest: Effort normal and breath sounds normal.  Abdominal: Soft. Bowel sounds are normal.  Musculoskeletal: She exhibits no deformity.  Neurological: She is alert and  oriented to person, place, and time.  Skin: Skin is warm and dry.  Psychiatric: Mood and affect normal.     CMP Latest Ref Rng & Units 01/31/2018  Glucose 65 - 99 mg/dL 161(W)  BUN 6 - 20 mg/dL 13  Creatinine 9.60 - 4.54 mg/dL 0.98  Sodium 119 - 147 mmol/L 137  Potassium 3.5 - 5.1 mmol/L 3.9  Chloride 101 - 111 mmol/L 102  CO2 22 - 32 mmol/L 25  Calcium 8.9 - 10.3 mg/dL 9.3  Total Protein 6.5 - 8.1 g/dL 7.2  Total Bilirubin 0.3 - 1.2 mg/dL 0.4  Alkaline Phos 38 - 126 U/L 35(L)  AST 15 - 41 U/L 43(H)  ALT 14 - 54 U/L 34   CBC Latest Ref Rng & Units 01/31/2018  WBC 3.6 - 11.0 K/uL 6.7  Hemoglobin 12.0 - 16.0 g/dL 82.9  Hematocrit 56.2 - 47.0 % 40.7  Platelets 150 - 440 K/uL 227     Assessment and plan- Patient is a 47 y.o. female with porphyria cutanea tarda.   3 months ago she returned to clinic for labs. At that time CBC was normal. Ferritin was 42. Patient was asymptomatic and she did not receive phlebotomy.  Today, CBC is again normal. AST is slightly elevated at 43. Ferritin is 62. She is asymptomatic and denies any recent skin blistering. Will hold phlebotomy at this time. Would consider phlebotomy if ferritin is greater than 100.   RTC in 3 months for CBC, CMP, and ferritin. In 6 months, rtc for labs and re-evaluation with Dr. Smith Robert.   Visit Diagnosis 1. Porphyria cutanea tarda (HCC)      Consuello Masse, DNP, AGNP-C [facility] [phone] (work cell) [phone] (office) 02/02/18 4:59 PM

## 2018-02-14 MED FILL — AMLODIPINE BESYLATE 5 MG TA: 5 | 90 days supply | Qty: 90 | Fill #1

## 2018-02-14 MED FILL — VENLAFAXINE HCL ER 150 MG C: 150 | 90 days supply | Qty: 90 | Fill #3

## 2018-02-14 MED FILL — HYDROCHLOROTHIAZIDE 25 MG T: 25 | 90 days supply | Qty: 90 | Fill #1

## 2018-02-14 MED FILL — LOSARTAN POTASSIUM 50 MG TA: 50 | 90 days supply | Qty: 90 | Fill #1

## 2018-03-07 IMAGING — MG MM DIGITAL SCREENING BILAT W/ TOMO W/ CAD
8 of 16 series · 8 of 32 positions shown · non-contrast
Comparison: Previous exam(s).

CLINICAL DATA: Screening.

EXAM:
2D DIGITAL SCREENING BILATERAL MAMMOGRAM WITH CAD AND ADJUNCT TOMO

[R MLO (1 of 2)]
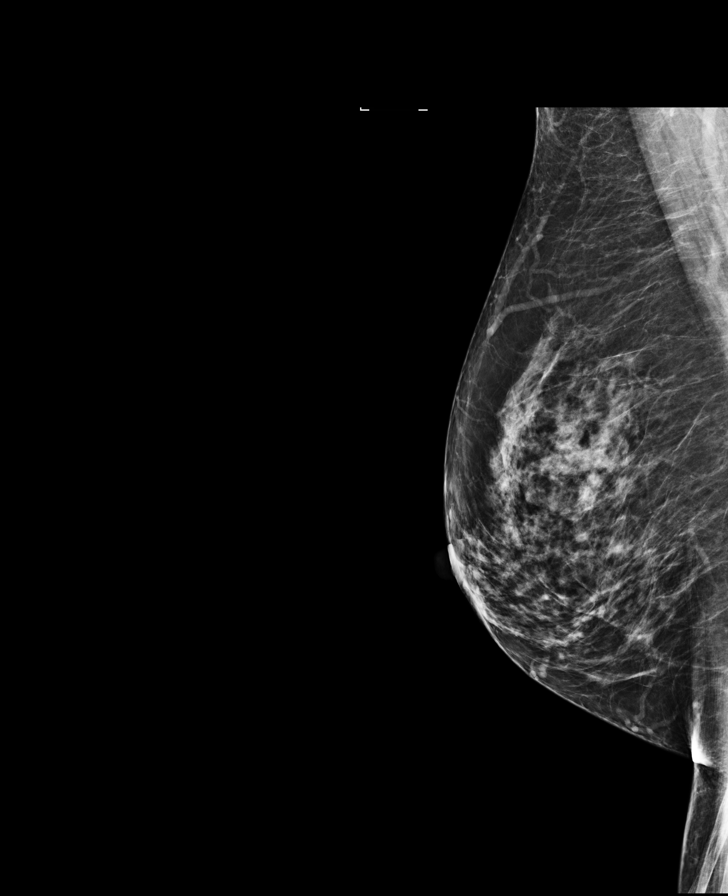

[L MLO]
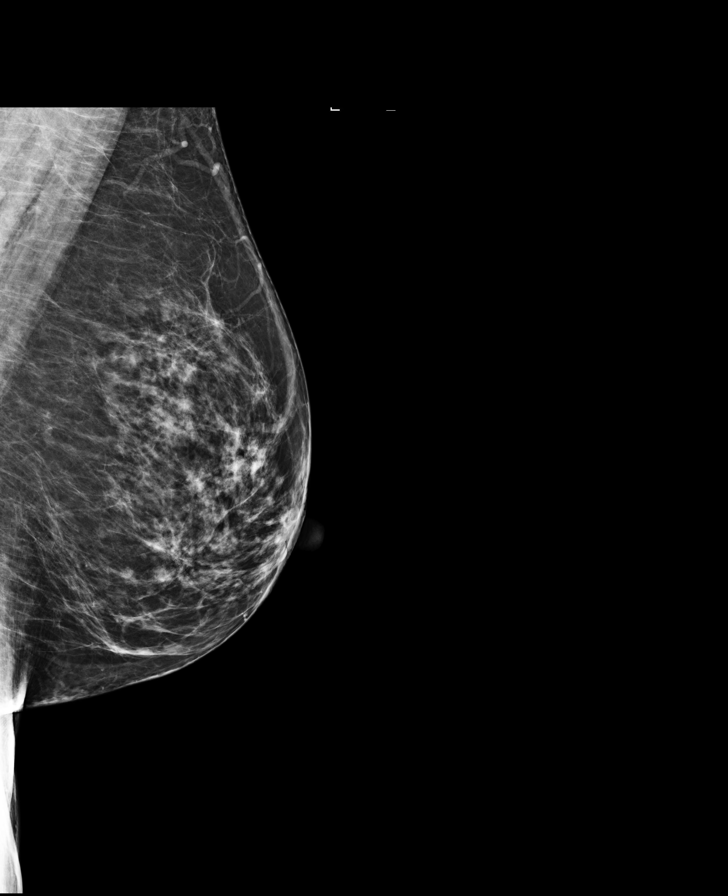

[L CC]
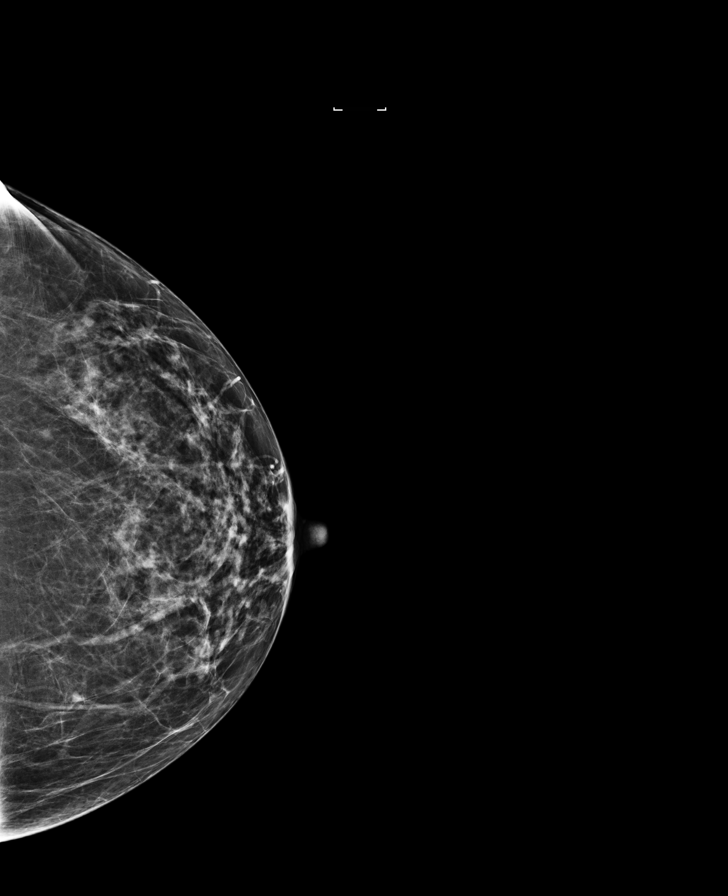

[R CC]
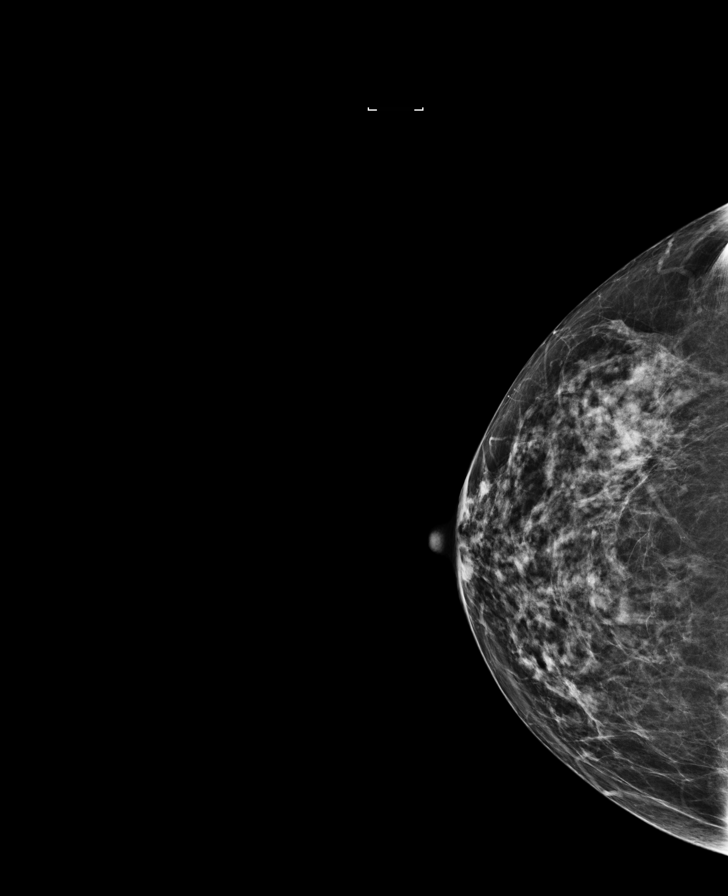

[R CC synth-2D]
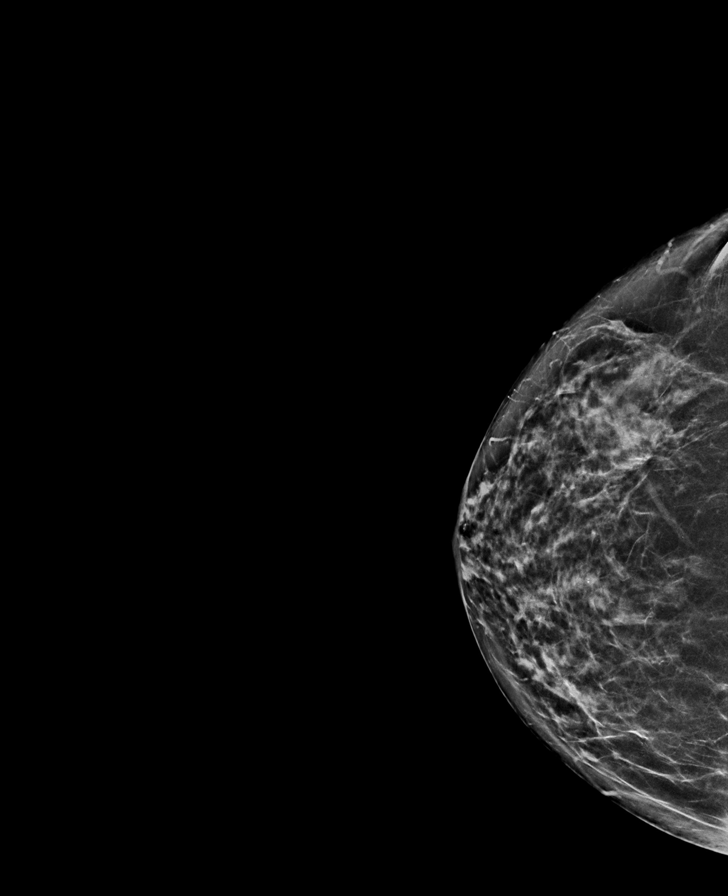

[R MLO synth-2D]
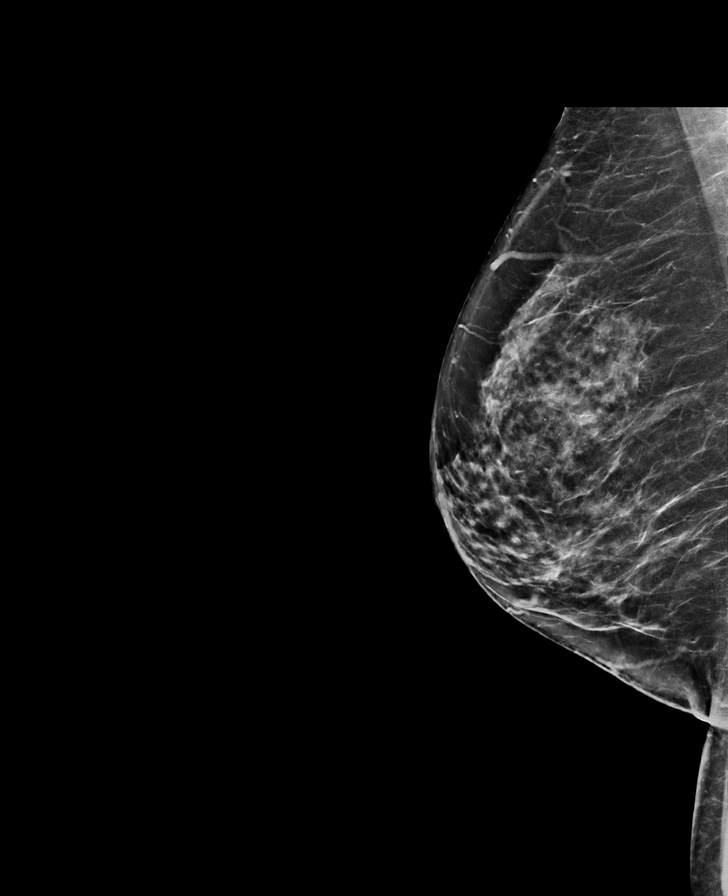

[R MLO (2 of 2)]
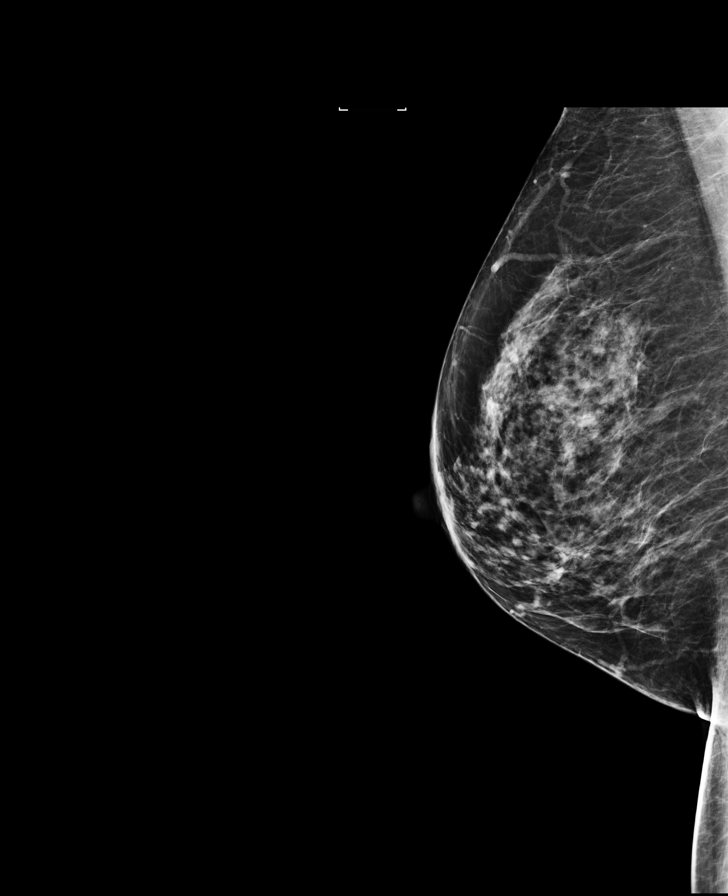

[L MLO synth-2D]
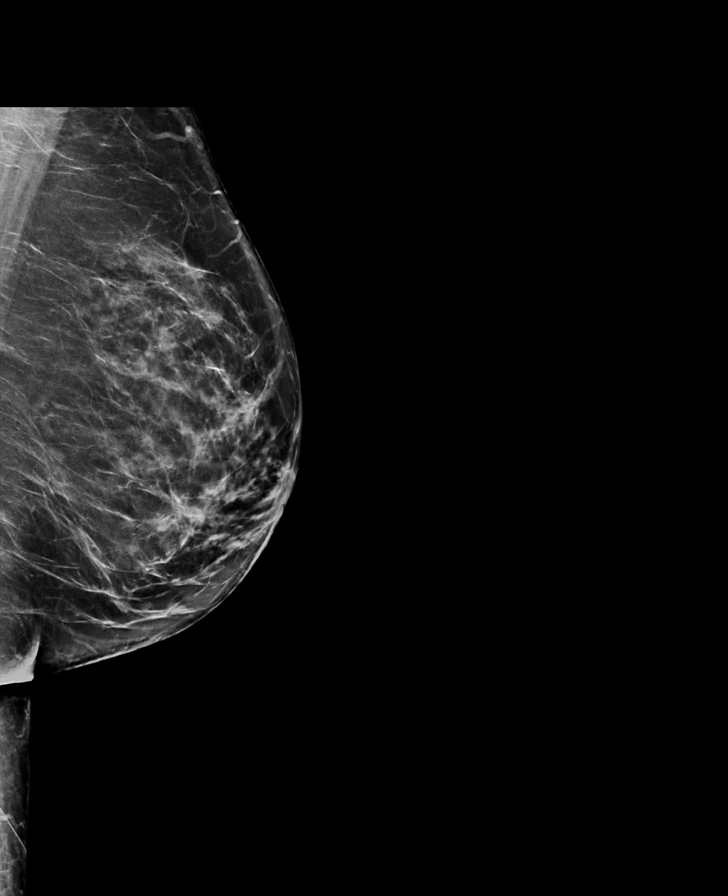

[8 of 32 positions shown; findings below may reference images not displayed]

ACR Breast Density Category c: The breast tissue is heterogeneously
dense, which may obscure small masses.
FINDINGS: In the right breast, calcifications warrant further evaluation with
magnified views. In the left breast, no findings suspicious for
malignancy. Images were processed with CAD.
IMPRESSION: Further evaluation is suggested for calcifications in the right
breast.

RECOMMENDATION:
Diagnostic mammogram of the right breast. (Code:[id number]-999)

The patient will be contacted regarding the findings, and additional
imaging will be scheduled.

BI-RADS CATEGORY  0: Incomplete. Need additional imaging evaluation
and/or prior mammograms for comparison.

## 2018-05-02 ENCOUNTER — Inpatient Hospital Stay: Payer: 59 | Attending: Oncology

## 2018-05-02 LAB — COMPREHENSIVE METABOLIC PANEL
ALBUMIN: 4 g/dL (ref 3.5–5.0)
ALK PHOS: 39 U/L (ref 38–126)
ALT: 34 U/L (ref 0–44)
AST: 40 U/L (ref 15–41)
Anion gap: 8 (ref 5–15)
BILIRUBIN TOTAL: 0.4 mg/dL (ref 0.3–1.2)
BUN: 12 mg/dL (ref 6–20)
CALCIUM: 9.3 mg/dL (ref 8.9–10.3)
CO2: 27 mmol/L (ref 22–32)
Chloride: 104 mmol/L (ref 98–111)
Creatinine, Ser: 0.66 mg/dL (ref 0.44–1.00)
GFR calc Af Amer: >60 mL/min (ref >60)
GLUCOSE: 193 mg/dL — AB (ref 70–99)
POTASSIUM: 3.9 mmol/L (ref 3.5–5.1)
Sodium: 139 mmol/L (ref 135–145)
TOTAL PROTEIN: 7.2 g/dL (ref 6.5–8.1)

## 2018-05-02 LAB — CBC WITH DIFFERENTIAL/PLATELET
BASOS ABS: 0 10*3/uL (ref 0–0.1)
BASOS PCT: 1 %
EOS ABS: 0.1 10*3/uL (ref 0–0.7)
EOS PCT: 2 %
HCT: 42.4 % (ref 35.0–47.0)
Hemoglobin: 14.3 g/dL (ref 12.0–16.0)
Lymphocytes Relative: 31 %
Lymphs Abs: 2.1 10*3/uL (ref 1.0–3.6)
MCH: 32.7 pg (ref 26.0–34.0)
MCHC: 33.9 g/dL (ref 32.0–36.0)
MCV: 96.5 fL (ref 80.0–100.0)
MONO ABS: 0.4 10*3/uL (ref 0.2–0.9)
Monocytes Relative: 6 %
Neutro Abs: 4.1 10*3/uL (ref 1.4–6.5)
Neutrophils Relative %: 60 %
PLATELETS: 224 10*3/uL (ref 150–440)
RBC: 4.39 MIL/uL (ref 3.80–5.20)
RDW: 13 % (ref 11.5–14.5)
WBC: 6.8 10*3/uL (ref 3.6–11.0)

## 2018-05-02 LAB — FERRITIN: FERRITIN: 63 ng/mL (ref 11–307)

## 2018-05-03 ENCOUNTER — Telehealth: Payer: Self-pay

## 2018-05-03 NOTE — Telephone Encounter (Signed)
-----   Message from Creig HinesArchana C Rao, MD sent at 05/03/2018  8:29 AM EDT ----- Goal ferritin <100. She does not need phlebotomy at this time. Thanks, Helen KinArchana

## 2018-05-03 NOTE — Telephone Encounter (Signed)
To inform the patient that she does not need phlebotomy at this time/ and your goal for ferritin will be 100 at your levels are at 63. I also states if any question please feel free to contact [place] at [phone].

## 2018-05-09 ENCOUNTER — Inpatient Hospital Stay: Payer: 59 | Attending: Oncology

## 2018-05-15 MED FILL — HYDROCHLOROTHIAZIDE 25 MG T: 25 | 90 days supply | Qty: 90 | Fill #0

## 2018-05-15 MED FILL — LOSARTAN POTASSIUM 50 MG TA: 50 | 90 days supply | Qty: 90 | Fill #0

## 2018-05-15 MED FILL — VENLAFAXINE HCL ER 150 MG C: 150 | 90 days supply | Qty: 90 | Fill #0

## 2018-05-15 MED FILL — AMLODIPINE BESYLATE 5 MG TA: 5 | 90 days supply | Qty: 90 | Fill #0

## 2018-08-01 ENCOUNTER — Inpatient Hospital Stay: Payer: 59 | Attending: Oncology

## 2018-08-01 LAB — CBC WITH DIFFERENTIAL/PLATELET
Abs Immature Granulocytes: 0.03 10*3/uL (ref 0.00–0.07)
Basophils Absolute: 0.1 10*3/uL (ref 0.0–0.1)
Basophils Relative: 1 %
EOS ABS: 0.1 10*3/uL (ref 0.0–0.5)
EOS PCT: 2 %
HEMATOCRIT: 42.3 % (ref 36.0–46.0)
HEMOGLOBIN: 14.2 g/dL (ref 12.0–15.0)
Immature Granulocytes: 0 %
LYMPHS ABS: 2.5 10*3/uL (ref 0.7–4.0)
LYMPHS PCT: 31 %
MCH: 31.1 pg (ref 26.0–34.0)
MCHC: 33.6 g/dL (ref 30.0–36.0)
MCV: 92.8 fL (ref 80.0–100.0)
MONO ABS: 0.6 10*3/uL (ref 0.1–1.0)
MONOS PCT: 8 %
NEUTROS ABS: 4.6 10*3/uL (ref 1.7–7.7)
Neutrophils Relative %: 58 %
Platelets: 229 10*3/uL (ref 150–400)
RBC: 4.56 MIL/uL (ref 3.87–5.11)
RDW: 12.1 % (ref 11.5–15.5)
WBC: 7.9 10*3/uL (ref 4.0–10.5)
nRBC: 0 % (ref 0.0–0.2)

## 2018-08-01 LAB — COMPREHENSIVE METABOLIC PANEL
ALBUMIN: 4.1 g/dL (ref 3.5–5.0)
ALT: 32 U/L (ref 0–44)
AST: 40 U/L (ref 15–41)
Alkaline Phosphatase: 33 U/L — ABNORMAL LOW (ref 38–126)
Anion gap: 8 (ref 5–15)
BUN: 13 mg/dL (ref 6–20)
CHLORIDE: 99 mmol/L (ref 98–111)
CO2: 26 mmol/L (ref 22–32)
CREATININE: 0.5 mg/dL (ref 0.44–1.00)
Calcium: 9.3 mg/dL (ref 8.9–10.3)
GFR calc Af Amer: >60 mL/min (ref >60)
GFR calc non Af Amer: >60 mL/min (ref >60)
GLUCOSE: 104 mg/dL — AB (ref 70–99)
POTASSIUM: 3.8 mmol/L (ref 3.5–5.1)
SODIUM: 133 mmol/L — AB (ref 135–145)
Total Bilirubin: 0.5 mg/dL (ref 0.3–1.2)
Total Protein: 7.4 g/dL (ref 6.5–8.1)

## 2018-08-01 LAB — FERRITIN: FERRITIN: 88 ng/mL (ref 11–307)

## 2018-08-03 ENCOUNTER — Ambulatory Visit: Payer: 59 | Admitting: Oncology

## 2018-08-06 ENCOUNTER — Telehealth: Payer: Self-pay | Admitting: *Deleted

## 2018-08-06 ENCOUNTER — Other Ambulatory Visit: Payer: Self-pay | Admitting: *Deleted

## 2018-08-06 NOTE — Telephone Encounter (Signed)
See in may 2020 with labs cbc and ferritin

## 2018-08-06 NOTE — Telephone Encounter (Signed)
Patient called to get her recent lab results and to find out if she needs to keep her appointment this Thursday or if she should push it out for several months. Please advise.  [phone]

## 2018-08-06 NOTE — Telephone Encounter (Signed)
She does not need phlebotomy but I would like to see her once a year so she should keep her appointment. Thanks, Ovidio KinArchana

## 2018-08-06 NOTE — Telephone Encounter (Signed)
Hi Dr. Smith Robertao,  She actually saw Consuello MasseLauren Allen, NP in May, 2019 for follow up. Do you still want her to come in this week, or schedule her to see you in May, 2020?  Thanks, [employer] PNC FinancialDoni

## 2018-08-06 NOTE — Telephone Encounter (Signed)
Patient rescheduled to May 2020.

## 2018-08-09 ENCOUNTER — Ambulatory Visit: Payer: 59 | Admitting: Oncology

## 2018-09-13 ENCOUNTER — Ambulatory Visit (INDEPENDENT_AMBULATORY_CARE_PROVIDER_SITE_OTHER): Payer: Self-pay | Admitting: Physician Assistant

## 2018-09-13 ENCOUNTER — Encounter: Payer: Self-pay | Admitting: Physician Assistant

## 2018-09-13 VITALS — BP 140/90 | HR 82 | Temp 98.1°F | Resp 12 | Ht 63.0 in | Wt 181.0 lb

## 2018-09-13 DIAGNOSIS — J40 Bronchitis, not specified as acute or chronic: Secondary | ICD-10-CM

## 2018-09-13 MED ORDER — AZELASTINE HCL 0.1 % NA SOLN: 2.0000 | Freq: Two times a day (BID) | NASAL | 0 refills | Status: DC

## 2018-09-13 MED ORDER — PREDNISONE 10 MG (21) PO TBPK: ORAL_TABLET | ORAL | 0 refills | Status: DC

## 2018-09-13 MED ORDER — ALBUTEROL SULFATE HFA 108 (90 BASE) MCG/ACT IN AERS: 2.0000 | INHALATION_SPRAY | RESPIRATORY_TRACT | 0 refills | Status: AC | PRN

## 2018-09-13 MED ORDER — FEXOFENADINE HCL 180 MG PO TABS: 180.0000 mg | ORAL_TABLET | Freq: Every day | ORAL | 0 refills | Status: DC

## 2018-09-13 NOTE — Progress Notes (Signed)
Patient ID: Helen Martinez DOB: [birth year] AGE: 48 y.o. MRN: [id number]   PCP: Helen Cookey, FNP   Chief Complaint:  Chief Complaint  Patient presents with  . Sore Throat    x1d  . Cough    x1d  . Wheezing    x1wk     Subjective:    HPI:  Helen Martinez is a 48 y.o. female presents for evaluation  Chief Complaint  Patient presents with  . Sore Throat    x1d  . Cough    x1d  . Wheezing    x12wk    48 year old female presents to [facility] with one week history of cough. Coarse. Associated chest congestion and wheezing. Cough rarely productive. Yesterday developed bilateral maxillary and frontal sinus pressure/congestion, postnasal drip, and sore throat. Has not taken any OTC medication for symptom relief. Denies fever, chills, headache, ear pain, chest pain, SOB. Patient is a cigarette smoker; averages 1ppd. Patient denies asthma, COPD, or emphysema diagnosis. Patient has previously been prescribed albuterol inhaler for bronchitis (has since expired).   Patient seen at [facility] on 11/09/2017 with 4 day history of flu like symptoms. Negative rapid flu test. Patient diagnosed with sinusitis. Prescried 10-day course of Augmentin and Benzonatate for cough.  Patient seen at [facility] on 01/05/2018 with 5 day history of sore throat, cough, and wheezing. Diagnosed with bronchitis. Prescribed Doxycycline, albuterol inhaler, Singulair, and Atrovent nasal spray. Patient felt symptoms improved, then returned. Reseen at [facility] on 01/16/2018. Prescribed prednisone.  Patient with porphyria cutanea tarda. [facility]ly managed by [facility]. Diagnosed >25 years ago. Currently controlled; asymptomatic since last office visit, 02/02/2018. Undergoes blood work every 3 months.  A limited review of symptoms was performed, pertinent positives and negatives as mentioned in HPI.  The following portions of the patient's  history were reviewed and updated as appropriate: allergies, current medications and past medical history.  Patient Active Problem List   Diagnosis Date Noted  . Porphyria cutanea tarda (HCC) 01/08/2017  . Anal fissure 06/02/2016  . Rectal pain 06/02/2016  . Loose stools 06/02/2016  . Contusion of lower back 09/18/2013    Allergies  Allergen Reactions  . Lisinopril Cough    Current Outpatient Medications on File Prior to Visit  Medication Sig Dispense Refill  . amLODipine (NORVASC) 10 MG tablet Take 10 mg by mouth daily.    . hydrochlorothiazide (HYDRODIURIL) 25 MG tablet Take 1 tablet by mouth daily.  2  . ibuprofen (ADVIL,MOTRIN) 200 MG tablet Take 800 mg by mouth every 6 (six) hours as needed for mild pain or moderate pain.    Marland Kitchen losartan (COZAAR) 50 MG tablet Take 1 tablet by mouth daily.  2  . omeprazole (PRILOSEC OTC) 20 MG tablet Take 20 mg by mouth daily.    Marland Kitchen venlafaxine XR (EFFEXOR-XR) 150 MG 24 hr capsule Take 150 mg by mouth daily with breakfast.    . albuterol (PROVENTIL HFA;VENTOLIN HFA) 108 (90 Base) MCG/ACT inhaler Inhale 2 puffs into the lungs every 6 (six) hours as needed for wheezing or shortness of breath. (Patient not taking: Reported on 09/13/2018) 1 Inhaler 0   No current facility-administered medications on file prior to visit.        Objective:   Vitals:   09/13/18 1100  BP: 140/90  Pulse: 82  Resp: 12  Temp: 98.1 F (36.7 C)  SpO2: 98%     Wt Readings from Last 3 Encounters:  09/13/18 181 lb (  82.1 kg)  02/02/18 181 lb (82.1 kg)  01/16/18 181 lb 3.2 oz (82.2 kg)    Physical Exam:   General Appearance:  Patient sitting comfortably on examination table. Conversational. Peri JeffersonGood self-historian. In no acute distress. Afebrile.   Head:  Normocephalic, without obvious abnormality, atraumatic  Eyes:  PERRL, conjunctiva/corneas clear, EOM's intact  Ears:  Bilateral ear canals WNL. No erythema or edema. No discharge/drainage. Bilateral TMs WNL. No  erythema, injection, or serous effusion. No scar tissue.  Nose: Nares normal, septum midline. Nasal mucosa reveals bilateral edema. Scant clear rhinorrhea. No sinus tenderness with percussion/palpation. Subjective bilateral maxillary and frontal sinus pressure and congestion.  Throat: Lips, mucosa, and tongue normal; teeth and gums normal. Throat reveals no erythema. Tonsils with no enlargement or exudate.  Neck: Supple, symmetrical, trachea midline, no lymphadenopathy  Lungs:   Good aeration. No respiratory distress. No tachypnea. Wheezing with forced expiration in lower lobes bilaterally. Coarse cough, when patient is asked to cough in to her elbow. No rhonchi or crackles. 98% pulse ox.  Heart:  Regular rate and rhythm, S1 and S2 normal, no murmur, rub, or gallop  Extremities: Extremities normal, atraumatic, no cyanosis or edema  Pulses: 2+ and symmetric  Skin: Skin color, texture, turgor normal, no rashes or lesions  Lymph nodes: Cervical, supraclavicular, and axillary nodes normal  Neurologic: Normal    Assessment & Plan:    Exam findings, diagnosis etiology and medication use and indications reviewed with patient. Follow-Up and discharge instructions provided. No emergent/urgent issues found on exam.  Patient education was provided.   Patient verbalized understanding of information provided and agrees with plan of care (POC), all questions answered. The patient is advised to call or return to clinic if condition does not see an improvement in symptoms, or to seek the care of the closest emergency department if condition worsens with the below plan.    1. Bronchitis  - predniSONE (STERAPRED UNI-PAK 21 TAB) 10 MG (21) TBPK tablet; 6tabs po qd x1day, then 5tabs po qd x1day, then 4tabs po qd x1day, then 3tabs po qd x1day, then 2tabs po qd x1day, then 1tab po qd x1day  Dispense: 21 tablet; Refill: 0 - albuterol (PROVENTIL HFA;VENTOLIN HFA) 108 (90 Base) MCG/ACT inhaler; Inhale 2 puffs into  the lungs every 4 (four) hours as needed.  Dispense: 1 Inhaler; Refill: 0 - fexofenadine (ALLEGRA ALLERGY) 180 MG tablet; Take 1 tablet (180 mg total) by mouth daily for 14 days.  Dispense: 14 tablet; Refill: 0 - azelastine (ASTELIN) 0.1 % nasal spray; Place 2 sprays into both nostrils 2 (two) times daily. Use in each nostril as directed  Dispense: 30 mL; Refill: 0   Patient with one week history of cough/wheezing. VSS. Afebrile. In no acute distress. Expiratory wheezing with forced expiration in bilateral lobes bilaterally. Diagnosed with bronchitis. Prescribed Sterapred, albuterol inhaler, Allegra, and Astelin nasal spray. Patient with history of bronchitis, typically twice a year. Patient is a cigarette smoker. No asthma, COPD, emphysema diagnosis. Patient advised to f[facility], [facility], or urgent care in one week if symptoms not improving, sooner with any worsening symptoms.   Janalyn HarderSamantha Rozell Kettlewell, MHS, PA-C Rulon SeraSamantha F. Daelan Gatt, MHS, PA-C Advanced Practice Provider [facility]  [facility]  [address], [facility], [address]st [address] [place], [place] [address] (p):  [phone] [email or web address]

## 2018-09-13 NOTE — Patient Instructions (Signed)
Thank you for choosing [facility] for your health care needs.  You have been diagnosed with bronchitis.  Take medications as prescribed: Meds ordered this encounter  Medications  . predniSONE (STERAPRED UNI-PAK 21 TAB) 10 MG (21) TBPK tablet    Sig: 6tabs po qd x1day, then 5tabs po qd x1day, then 4tabs po qd x1day, then 3tabs po qd x1day, then 2tabs po qd x1day, then 1tab po qd x1day    Dispense:  21 tablet    Refill:  0    Order Specific Question:   Supervising Provider    Answer:   Eber HongMILLER, BRIAN [[id number]]  . albuterol (PROVENTIL HFA;VENTOLIN HFA) 108 (90 Base) MCG/ACT inhaler    Sig: Inhale 2 puffs into the lungs every 4 (four) hours as needed.    Dispense:  1 Inhaler    Refill:  0    Order Specific Question:   Supervising Provider    Answer:   MILLER, BRIAN [[id number]]  . fexofenadine (ALLEGRA ALLERGY) 180 MG tablet    Sig: Take 1 tablet (180 mg total) by mouth daily for 14 days.    Dispense:  14 tablet    Refill:  0    Order Specific Question:   Supervising Provider    Answer:   MILLER, BRIAN [[id number]]  . azelastine (ASTELIN) 0.1 % nasal spray    Sig: Place 2 sprays into both nostrils 2 (two) times daily. Use in each nostril as directed    Dispense:  30 mL    Refill:  0    Order Specific Question:   Supervising Provider    Answer:   Hyacinth MeekerMILLER, BRIAN [[id number]]   Increase fluids. Rest. May use over the counter Delsym or Robitussin for cough. May take over the counter Tylenol or ibuprofen for pain/fever.  Follow-up with [facility], family physician, or urgent care in one week if symptoms not improving. Sooner with any worsening symptoms.  Acute Bronchitis, Adult Acute bronchitis is when air tubes (bronchi) in the lungs suddenly get swollen. The condition can make it hard to breathe. It can also cause these symptoms:  A cough.  Coughing up clear, yellow, or green mucus.  Wheezing.  Chest congestion.  Shortness of breath.  A fever.  Body aches.  Chills.  A sore throat. Follow  these instructions at home:  Medicines  Take over-the-counter and prescription medicines only as told by your doctor.  If you were prescribed an antibiotic medicine, take it as told by your doctor. Do not stop taking the antibiotic even if you start to feel better. General instructions  Rest.  Drink enough fluids to keep your pee (urine) pale yellow.  Avoid smoking and secondhand smoke. If you smoke and you need help quitting, ask your doctor. Quitting will help your lungs heal faster.  Use an inhaler, cool mist vaporizer, or humidifier as told by your doctor.  Keep all follow-up visits as told by your doctor. This is important. How is this prevented? To lower your risk of getting this condition again:  Wash your hands often with soap and water. If you cannot use soap and water, use hand sanitizer.  Avoid contact with people who have cold symptoms.  Try not to touch your hands to your mouth, nose, or eyes.  Make sure to get the flu shot every year. Contact a doctor if:  Your symptoms do not get better in 2 weeks. Get help right away if:  You cough up blood.  You have chest pain.  You have very  bad shortness of breath.  You become dehydrated.  You faint (pass out) or keep feeling like you are going to pass out.  You keep throwing up (vomiting).  You have a very bad headache.  Your fever or chills gets worse. This information is not intended to replace advice given to you by your health care provider. Make sure you discuss any questions you have with your health care provider. Document Released: 02/08/2008 Document Revised: 04/05/2017 Document Reviewed: 02/10/2016 Elsevier Interactive Patient Education  2019 [employer].

## 2018-09-17 ENCOUNTER — Telehealth: Payer: Self-pay | Admitting: Emergency Medicine

## 2018-09-17 NOTE — Telephone Encounter (Signed)
Left message following up on visit with [facility] 

## 2018-09-21 ENCOUNTER — Telehealth: Payer: Self-pay | Admitting: Physician Assistant

## 2018-09-21 ENCOUNTER — Telehealth: Payer: Self-pay | Admitting: Emergency Medicine

## 2018-09-21 NOTE — Telephone Encounter (Addendum)
Patient contacted office today requesting an excuse note for the 10th of January. Was seen on the 9th and was given a note for that day but stated that she also stayed out of work on the 10th. Per patient said you all had discussed keeping her out on the 10th and  She thought she would of been able to go back to work. But didn't could you provide her a note.

## 2018-09-21 NOTE — Telephone Encounter (Signed)
Work excuse note completed for 09/14/2018. SFS PA-C

## 2018-09-21 NOTE — Telephone Encounter (Deleted)
Yeah. No problem. Note printed. Patient can pick up at [facility].

## 2018-09-27 ENCOUNTER — Encounter: Payer: Self-pay | Admitting: Physician Assistant

## 2018-09-27 ENCOUNTER — Ambulatory Visit (INDEPENDENT_AMBULATORY_CARE_PROVIDER_SITE_OTHER): Payer: Self-pay | Admitting: Physician Assistant

## 2018-09-27 VITALS — BP 122/80 | HR 85 | Temp 98.8°F | Resp 14 | Wt 177.8 lb

## 2018-09-27 DIAGNOSIS — J069 Acute upper respiratory infection, unspecified: Secondary | ICD-10-CM

## 2018-09-27 DIAGNOSIS — R6889 Other general symptoms and signs: Secondary | ICD-10-CM

## 2018-09-27 LAB — POCT INFLUENZA A/B
INFLUENZA A, POC: NEGATIVE
Influenza B, POC: NEGATIVE

## 2018-09-27 LAB — POCT RAPID STREP A (OFFICE): RAPID STREP A SCREEN: NEGATIVE

## 2018-09-27 MED ORDER — OXYMETAZOLINE HCL 0.05 % NA SOLN: 1.0000 | Freq: Two times a day (BID) | NASAL | 0 refills | Status: DC

## 2018-09-27 MED ORDER — FLUTICASONE PROPIONATE 50 MCG/ACT NA SUSP: 2.0000 | Freq: Every day | NASAL | 0 refills | Status: DC

## 2018-09-27 MED ORDER — MUCINEX DM MAXIMUM STRENGTH 60-1200 MG PO TB12: 1.0000 | ORAL_TABLET | Freq: Two times a day (BID) | ORAL | 0 refills | Status: DC

## 2018-09-27 NOTE — Progress Notes (Signed)
MRN: [id number] DOB: 1971/01/13  Subjective:   Helen Martinez is a 48 y.o. female presenting for chief complaint of sore thraot, headache and cough (x 3 days (no otc meds)) .  Reports 3 day history of sore throat, nasal congestion, ear fullness, headache, and chills. Denies  fever, sinus pain, pain with swallowing, inability to swallow, voice change, productive cough, wheezing, shortness of breath, chest tightness, chest pain and myalgia, nausea, vomiting, abdominal pain and diarrhea. Has not tried anything for relief. Has had sick exposure to coworkers with flu and strep, would like to be tested for both. Has PMH of seasonal allergies and HTN. No PMH of asthma, COPD, DM, thyroid disorder, or autoimmune disease.  Patient has had flu shot this season. Current every day smoker, smokes ~1ppd. Denies any other aggravating or relieving factors, no other questions or concerns.  Review of Systems  Constitutional: Negative for diaphoresis.  Musculoskeletal: Negative for neck pain.  Skin: Negative for rash.    Helen Martinez has a current medication list which includes the following prescription(s): albuterol, amlodipine, azelastine, mucinex dm maximum strength, fexofenadine, fluticasone, hydrochlorothiazide, ibuprofen, losartan, omeprazole, oxymetazoline, prednisone, and venlafaxine xr. Also is allergic to lisinopril.  Helen Martinez  has a past medical history of Depression, GERD (gastroesophageal reflux disease), and Hypertension. Also  has a past surgical history that includes Abdominal hysterectomy; Oophorectomy; and Breast biopsy (Right, 06/14/2017).   Objective:   Vitals: BP 122/80   Pulse 85   Temp 98.8 F (37.1 C)   Resp 14   Wt 177 lb 12.8 oz (80.6 kg)   SpO2 95%   BMI 31.50 kg/m   Physical Exam Vitals signs reviewed.  Constitutional:      General: She is not in acute distress.    Appearance: She is well-developed. She is not ill-appearing or toxic-appearing.  HENT:     Head: Normocephalic and  atraumatic.     Right Ear: Ear canal and external ear normal. A middle ear effusion is present. Tympanic membrane is not erythematous or bulging.     Left Ear: Ear canal and external ear normal. Tympanic membrane is not erythematous or bulging.     Nose: Mucosal edema and congestion present.     Right Sinus: No maxillary sinus tenderness or frontal sinus tenderness.     Left Sinus: No maxillary sinus tenderness or frontal sinus tenderness.     Mouth/Throat:     Lips: Pink.     Mouth: Mucous membranes are moist.     Pharynx: Uvula midline. Posterior oropharyngeal erythema present.     Tonsils: No tonsillar exudate or tonsillar abscesses. Swelling: 1+ on the right. 1+ on the left.     Comments: Tonsils are erythematous b/l Eyes:     Extraocular Movements: Extraocular movements intact.     Conjunctiva/sclera: Conjunctivae normal.     Pupils: Pupils are equal, round, and reactive to light.  Neck:     Musculoskeletal: Full passive range of motion without pain and normal range of motion. No neck rigidity.     Trachea: Phonation normal.  Cardiovascular:     Rate and Rhythm: Normal rate and regular rhythm.     Heart sounds: Normal heart sounds.  Pulmonary:     Effort: Pulmonary effort is normal.     Breath sounds: Normal breath sounds. No decreased breath sounds, wheezing, rhonchi or rales.  Lymphadenopathy:     Head:     Right side of head: No submental, submandibular, tonsillar, preauricular, posterior auricular or occipital adenopathy.  Left side of head: No submental, submandibular, tonsillar, preauricular, posterior auricular or occipital adenopathy.     Cervical: No cervical adenopathy.     Upper Body:     Right upper body: No supraclavicular adenopathy.     Left upper body: No supraclavicular adenopathy.  Skin:    General: Skin is warm and dry.  Neurological:     Mental Status: She is alert.     Results for orders placed or performed in visit on 09/27/18 (from the past 24  hour(s))  POCT Influenza A/B     Status: Normal   Collection Time: 09/27/18 12:20 PM  Result Value Ref Range   Influenza A, POC Negative Negative   Influenza B, POC Negative Negative  POCT rapid strep A     Status: Normal   Collection Time: 09/27/18 12:21 PM  Result Value Ref Range   Rapid Strep A Screen Negative Negative    Assessment and Plan :  1. Viral URI Patient is overall well-appearing, no acute distress.  VSS. History and physical exam are consistent with viral URI.  Given educational material on viral URI.  Recommend symptomatic treatment at this time.  Advised to follow-up in office, with family doctor, or local urgent care if no improvement in symptoms after 7 to 10 days.  Seek care sooner at local urgent care or ED if symptoms worsen/develop new concerning symptoms.  Patient voices understanding. - fluticasone (FLONASE) 50 MCG/ACT nasal spray; Place 2 sprays into both nostrils daily.  Dispense: 16 g; Refill: 0 - oxymetazoline (AFRIN NASAL SPRAY) 0.05 % nasal spray; Place 1 spray into both nostrils 2 (two) times daily.  Dispense: 30 mL; Refill: 0 - Dextromethorphan-guaiFENesin (MUCINEX DM MAXIMUM STRENGTH) 60-1200 MG TB12; Take 1 tablet by mouth every 12 (twelve) hours.  Dispense: 20 each; Refill: 0  2. Flu-like symptoms - POCT Influenza A/B - POCT rapid strep A   Benjiman CoreBrittany Raj Landress, PA-C  [facility] 09/27/2018 12:26 PM

## 2018-09-27 NOTE — Patient Instructions (Addendum)
Viral Respiratory Infection Your rapid strep and flu test were negative, which is reassuring.   For congestion: Start afrin and flonase, discontinue afrin after 3 days, continue with flonase. Start your oral antihistamine. Use mucinex-DM.  Return to clinic if symptoms do not improve in one 5-7 days, seek care sooner if symptoms worsen.   A viral respiratory infection is an illness that affects parts of the body that are used for breathing. These include the lungs, nose, and throat. It is caused by a germ called a virus. Some examples of this kind of infection are:  A cold.  The flu (influenza).  A respiratory syncytial virus (RSV) infection. A person who gets this illness may have the following symptoms:  A stuffy or runny nose.  Yellow or green fluid in the nose.  A cough.  Sneezing.  Tiredness (fatigue).  Achy muscles.  A sore throat.  Sweating or chills.  A fever.  A headache. Follow these instructions at home: Managing pain and congestion  Take over-the-counter and prescription medicines only as told by your doctor.  If you have a sore throat, gargle with salt water. Do this 3-4 times per day or as needed. To make a salt-water mixture, dissolve -1 tsp of salt in 1 cup of warm water. Make sure that all the salt dissolves.  Use nose drops made from salt water. This helps with stuffiness (congestion). It also helps soften the skin around your nose.  Drink enough fluid to keep your pee (urine) pale yellow. General instructions   Rest as much as possible.  Do not drink alcohol.  Do not use any products that have nicotine or tobacco, such as cigarettes and e-cigarettes. If you need help quitting, ask your doctor.  Keep all follow-up visits as told by your doctor. This is important. How is this prevented?   Get a flu shot every year. Ask your doctor when you should get your flu shot.  Do not let other people get your germs. If you are sick: ? Stay home from  work or school. ? Wash your hands with soap and water often. Wash your hands after you cough or sneeze. If soap and water are not available, use hand sanitizer.  Avoid contact with people who are sick during cold and flu season. This is in fall and winter. Get help if:  Your symptoms last for 10 days or longer.  Your symptoms get worse over time.  You have a fever.  You have very bad pain in your face or forehead.  Parts of your jaw or neck become very swollen. Get help right away if:  You feel pain or pressure in your chest.  You have shortness of breath.  You faint or feel like you will faint.  You keep throwing up (vomiting).  You feel confused. Summary  A viral respiratory infection is an illness that affects parts of the body that are used for breathing.  Examples of this illness include a cold, the flu, and respiratory syncytial virus (RSV) infection.  The infection can cause a runny nose, cough, sneezing, sore throat, and fever.  Follow what your doctor tells you about taking medicines, drinking lots of fluid, washing your hands, resting at home, and avoiding people who are sick. This information is not intended to replace advice given to you by your health care provider. Make sure you discuss any questions you have with your health care provider. Document Released: 08/04/2008 Document Revised: 10/02/2017 Document Reviewed: 10/02/2017 Elsevier Interactive  Patient Education  2019 [employer].

## 2018-11-01 ENCOUNTER — Ambulatory Visit (INDEPENDENT_AMBULATORY_CARE_PROVIDER_SITE_OTHER): Payer: Self-pay | Admitting: Physician Assistant

## 2018-11-01 VITALS — BP 122/80 | HR 105 | Temp 99.3°F | Resp 14 | Wt 176.0 lb

## 2018-11-01 DIAGNOSIS — R062 Wheezing: Secondary | ICD-10-CM

## 2018-11-01 DIAGNOSIS — J101 Influenza due to other identified influenza virus with other respiratory manifestations: Secondary | ICD-10-CM

## 2018-11-01 DIAGNOSIS — R6889 Other general symptoms and signs: Secondary | ICD-10-CM

## 2018-11-01 LAB — POCT INFLUENZA A/B
Influenza A, POC: POSITIVE — AB
Influenza B, POC: NEGATIVE

## 2018-11-01 MED ORDER — BALOXAVIR MARBOXIL(80 MG DOSE) 2 X 40 MG PO TBPK: 80.0000 mg | ORAL_TABLET | Freq: Once | ORAL | 0 refills | Status: AC

## 2018-11-01 NOTE — Patient Instructions (Signed)
Thank you for choosing [facility] for your health care needs.  You have been diagnosed with influenza. Strain A. You were positive on rapid flu test.  You also have associated wheezing.  Take antiviral, Xofluza, as prescribed. Use albuterol inhaler, 2 puffs every 4 hours, for cough/wheezing.  Increase fluids. Rest. May continue to use over the counter ibuprofen for fever and pain/body aches. Take with food to prevent stomach upset.  You are considered contagious; most contagious first week from onset of symptoms. Regularly wash your hands, use antibacterial hand sanitizer, cough in to your elbow, and use Clorox wipes to clean your work station.  Follow-up with family physician or urgent care if symptoms not improving in 4-5 days. Sooner with any worsening symptoms.  Hope you feel better soon!  Influenza, Adult Influenza is also called "the flu." It is an infection in the lungs, nose, and throat (respiratory tract). It is caused by a virus. The flu causes symptoms that are similar to symptoms of a cold. It also causes a high fever and body aches. The flu spreads easily from person to person (is contagious). Getting a flu shot (influenza vaccination) every year is the best way to prevent the flu. What are the causes? This condition is caused by the influenza virus. You can get the virus by:  Breathing in droplets that are in the air from the cough or sneeze of a person who has the virus.  Touching something that has the virus on it (is contaminated) and then touching your mouth, nose, or eyes. What increases the risk? Certain things may make you more likely to get the flu. These include:  Not washing your hands often.  Having close contact with many people during cold and flu season.  Touching your mouth, eyes, or nose without first washing your hands.  Not getting a flu shot every year. You may have a higher risk for the flu, along with serious problems such as a lung infection  (pneumonia), if you:  Are older than 65.  Are pregnant.  Have a weakened disease-fighting system (immune system) because of a disease or taking certain medicines.  Have a long-term (chronic) illness, such as: ? Heart, kidney, or lung disease. ? Diabetes. ? Asthma.  Have a liver disorder.  Are very overweight (morbidly obese).  Have anemia. This is a condition that affects your red blood cells. What are the signs or symptoms? Symptoms usually begin suddenly and last 4-14 days. They may include:  Fever and chills.  Headaches, body aches, or muscle aches.  Sore throat.  Cough.  Runny or stuffy (congested) nose.  Chest discomfort.  Not wanting to eat as much as normal (poor appetite).  Weakness or feeling tired (fatigue).  Dizziness.  Feeling sick to your stomach (nauseous) or throwing up (vomiting). How is this treated? If the flu is found early, you can be treated with medicine that can help reduce how bad the illness is and how long it lasts (antiviral medicine). This may be given by mouth (orally) or through an IV tube. Taking care of yourself at home can help your symptoms get better. Your doctor may suggest:  Taking over-the-counter medicines.  Drinking plenty of fluids. The flu often goes away on its own. If you have very bad symptoms or other problems, you may be treated in a hospital. Follow these instructions at home:     Activity  Rest as needed. Get plenty of sleep.  Stay home from work or school as told  by your doctor. ? Do not leave home until you do not have a fever for 24 hours without taking medicine. ? Leave home only to visit your doctor. Eating and drinking  Take an ORS (oral rehydration solution). This is a drink that is sold at pharmacies and stores.  Drink enough fluid to keep your pee (urine) pale yellow.  Drink clear fluids in small amounts as you are able. Clear fluids include: ? Water. ? Ice chips. ? Fruit juice that has water  added (diluted fruit juice). ? Low-calorie sports drinks.  Eat bland, easy-to-digest foods in small amounts as you are able. These foods include: ? Bananas. ? Applesauce. ? Rice. ? Lean meats. ? Toast. ? Crackers.  Do not eat or drink: ? Fluids that have a lot of sugar or caffeine. ? Alcohol. ? Spicy or fatty foods. General instructions  Take over-the-counter and prescription medicines only as told by your doctor.  Use a cool mist humidifier to add moisture to the air in your home. This can make it easier for you to breathe.  Cover your mouth and nose when you cough or sneeze.  Wash your hands with soap and water often, especially after you cough or sneeze. If you cannot use soap and water, use alcohol-based hand sanitizer.  Keep all follow-up visits as told by your doctor. This is important. How is this prevented?   Get a flu shot every year. You may get the flu shot in late summer, fall, or winter. Ask your doctor when you should get your flu shot.  Avoid contact with people who are sick during fall and winter (cold and flu season). Contact a doctor if:  You get new symptoms.  You have: ? Chest pain. ? Watery poop (diarrhea). ? A fever.  Your cough gets worse.  You start to have more mucus.  You feel sick to your stomach.  You throw up. Get help right away if you:  Have shortness of breath.  Have trouble breathing.  Have skin or nails that turn a bluish color.  Have very bad pain or stiffness in your neck.  Get a sudden headache.  Get sudden pain in your face or ear.  Cannot eat or drink without throwing up. Summary  Influenza ("the flu") is an infection in the lungs, nose, and throat. It is caused by a virus.  Take over-the-counter and prescription medicines only as told by your doctor.  Getting a flu shot every year is the best way to avoid getting the flu. This information is not intended to replace advice given to you by your health care  provider. Make sure you discuss any questions you have with your health care provider. Document Released: 05/31/2008 Document Revised: 02/07/2018 Document Reviewed: 02/07/2018 Elsevier Interactive Patient Education  2019 [employer].

## 2018-11-01 NOTE — Progress Notes (Signed)
Patient ID: Helen Martinez DOB: [birth year] AGE: 48 y.o. MRN: [id number]   PCP: Helen Cookey, FNP   Chief Complaint:  Chief Complaint  Patient presents with  . fever, chills, cough and body aches    x1 day (ibuprofen)     Subjective:    HPI:  Helen Martinez is a 48 y.o. female presents for evaluation  Chief Complaint  Patient presents with  . fever, chills, cough and body aches    x1 day (ibuprofen)    48 year old female presents to [facility] with three day history of illness. Began on Tuesday 10/30/2018 with dry cough. Associated chest tightness and wheezing. Following day, woke up at 7am, with fever (101.39F), diffuse body aches, headache, and nasal congestion. Associated malaise and fatigue. Fever improved with ibuprofen 800mg  regularly (last dose 7am this morning, 4-1/2 hours ago). Cough improved with Alka Seltzer Cold & Flu. Patient has also been using albuterol inhaler. Patient denies official asthma, COPD or emphysema diagnosis. However, previous episodes of bronchitis. Patient is a cigarette smoker. Patient also with seasonal allergies. Patient denies dizziness/lightheadedness, ear pain, sinus pain, chest pain, SOB, nausea/vomiting, abdominal pain, diarrhea, rash.  Patient works in health care. Multiple sick contacts. No known specific influenza exposure. Patient has had this season's influenza vaccination. Patient with no previous pneumonia episode.  PATIENT'S RECENT INSTACARE VISITS:  Patient seen at [facility] with 3 day history of URI symptoms. Negative rapid flu and rapid strep test. Diagnosed with viral URI. Prescribed Flonase, Afrin nasal spray, and Mucinex-DM.  Patient seen at [facility] on 09/13/2018 with one week history of cough/wheezing. Diagnosed with bronchitis. Prescribed Sterapred pak, albuterol inhaler, Allegra, and Astelin nasal spray.  Patient seen at [facility] on 01/05/2018 with 5 day history of sore throat, cough,  and wheezing. Diagnosed with bronchitis. Prescribed Doxycycline, albuterol inhaler, Singulair, and Atrovent nasal spray. Patient felt symptoms improved, then returned. Reseen at [facility] on 01/16/2018. Prescribed prednisone.  Patient seen at [facility] on 11/09/2017 with 4 day history of flu like symptoms. Negative rapid flu test. Patient diagnosed with sinusitis. Prescried 10-day course of Augmentin and Benzonatate for cough.  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 Currently controlled; asymptomatic since last office visit, 02/02/2018. Undergoes blood work every 3 months.  A limited review of symptoms was performed, pertinent positives and negatives as mentioned in HPI.  The following portions of the patient's history were reviewed and updated as appropriate: allergies, current medications and past medical history.  Patient Active Problem List   Diagnosis Date Noted  . Porphyria cutanea tarda (HCC) 01/08/2017  . Anal fissure 06/02/2016  . Rectal pain 06/02/2016  . Loose stools 06/02/2016  . Hypertensive disorder 09/23/2015  . Contusion of lower back 09/18/2013    Allergies  Allergen Reactions  . Lisinopril Cough    Current Outpatient Medications on File Prior to Visit  Medication Sig Dispense Refill  . amLODipine (NORVASC) 10 MG tablet Take 10 mg by mouth daily.    . hydrochlorothiazide (HYDRODIURIL) 25 MG tablet Take 1 tablet by mouth daily.  2  . losartan (COZAAR) 50 MG tablet Take 1 tablet by mouth daily.  2  . omeprazole (PRILOSEC OTC) 20 MG tablet Take 20 mg by mouth daily.    Marland Kitchen venlafaxine XR (EFFEXOR-XR) 150 MG 24 hr capsule Take 150 mg by mouth daily with breakfast.    . albuterol (PROVENTIL HFA;VENTOLIN HFA) 108 (90 Base) MCG/ACT inhaler  Inhale 2 puffs into the lungs every 4 (four) hours as needed. (Patient not taking: Reported on 11/01/2018) 1 Inhaler 0  .  fexofenadine (ALLEGRA ALLERGY) 180 MG tablet Take 1 tablet (180 mg total) by mouth daily for 14 days. 14 tablet 0  . fluticasone (FLONASE) 50 MCG/ACT nasal spray Place 2 sprays into both nostrils daily. (Patient not taking: Reported on 11/01/2018) 16 g 0  . ibuprofen (ADVIL,MOTRIN) 200 MG tablet Take 800 mg by mouth every 6 (six) hours as needed for mild pain or moderate pain.    Marland Kitchen oxymetazoline (AFRIN NASAL SPRAY) 0.05 % nasal spray Place 1 spray into both nostrils 2 (two) times daily. (Patient not taking: Reported on 11/01/2018) 30 mL 0   No current facility-administered medications on file prior to visit.        Objective:   Vitals:   11/01/18 1137  BP: 122/80  Pulse: (!) 105  Resp: 14  Temp: 99.3 F (37.4 C)  SpO2: 95%     Wt Readings from Last 3 Encounters:  11/01/18 176 lb (79.8 kg)  09/27/18 177 lb 12.8 oz (80.6 kg)  09/13/18 181 lb (82.1 kg)    Physical Exam:   General Appearance:  Patient sitting comfortably on examination table. Conversational. Peri Jefferson self-historian. In no acute distress. Appears mildly uncomfortable. 99.55F temperature (antipyretic 4 hours ago).   Head:  Normocephalic, without obvious abnormality, atraumatic  Eyes:  PERRL, conjunctiva/corneas clear, EOM's intact  Ears:  Left ear canal WNL. No erythema or edema. No open wound. No visible purulent drainage. No tenderness with palpation over left tragus or with manipulation of left auricle. No visible erythema or edema of left mastoid. No tenderness with palpation over left mastoid. Right ear canal WNL. No erythema or edema. No open wound. No visible purulent drainage. No tenderness with palpation over right tragus or with manipulation of right auricle. No visible erythema or edema of right mastoid. No tenderness with palpation over right mastoid. Left TM WNL. Good light reflex. Visible landmarks. No erythema. No injection. No bulging or retraction. No visible perforation. No serous effusion. No visible purulent  effusion. No tympanostomy tube. No scar tissue. Right TM WNL. Good light reflex. Visible landmarks. No erythema. No injection. No bulging or retraction. No visible perforation. No serous effusion. No visible purulent effusion. No tympanostomy tube. No scar tissue.  Nose: Nares normal. Septum midline. No visible polyps. Nasal mucosa with bilateral edema. Thick yellow nasal discharge. No sinus tenderness with percussion/palpation.  Throat: Lips, mucosa, and tongue normal; teeth and gums normal. Throat reveals no erythema. No postnasal drip. No visible cobblestoning. Tonsils with no enlargement or exudate. Uvula midline with no edema or erythema.  Neck: Supple, symmetrical, trachea midline, no palpable lymphadenopathy  Lungs:   Good aeration. Diffuse expiratory wheezing, most evident in right lower lobe. No audible rhonchi or crackles. Tight/dry cough few times during examination.  Heart:  Regular rate and rhythm (heart rate slightly tachycardic, 105bpm), S1 and S2 normal, no murmur, rub, or gallop  Extremities: Extremities normal, atraumatic, no cyanosis or edema  Pulses: 2+ and symmetric  Skin: Skin color, texture, turgor normal, no rashes or lesions  Lymph nodes: Cervical, supraclavicular, and axillary nodes normal  Neurologic: Normal    Assessment & Plan:    Exam findings, diagnosis etiology and medication use and indications reviewed with patient. Follow-Up and discharge instructions provided. No emergent/urgent issues found on exam.  Patient education was provided.   Patient verbalized understanding of information provided and agrees with  plan of care (POC), all questions answered. The patient is advised to call or return to clinic if condition does not see an improvement in symptoms, or to seek the care of the closest emergency department if condition worsens with the below plan.    Patient given albuterol nebulizer treatment. Status post albuterol nebulizer treatment: subjective symptom  improvement, clearer lung sounds, continued wheezing, increase in pulse ox from 95% to 98%.   1. Influenza A - Baloxavir Marboxil,80 MG Dose, (XOFLUZA) 2 x 40 MG TBPK; Take 80 mg by mouth once for 1 dose.  Dispense: 1 each; Refill: 0  2. Flu-like symptoms - POCT Influenza A/B  3. Wheezing  Patient with 3 day history of cough and 2 day history of flu like symptoms (just greater than 24 hours of fever, body aches, and headache). Positive rapid flu test, strain A. Patient within window for antiviral, prescribed Xofluza. VSS, afebrile (99.51F temperature), in no acute distress, wheezing on lung auscultation (history of bronchospasm and current smoker). Patient received albuterol nebulizer treatment. Has not been using albuterol inhaler; advised increased usage. Discussed possible flu complications including pneumonia. Advised close monitoring of symptoms. Instructed patient to f/u with PCP, urgent care, or ED in 4-5 days if symptoms not improving, sooner with any worsening symptoms such as chest pain, SOB/dyspnea, etc. Discussed contagiousness of flu; provided work excuse note. Faxed note to fax number provided by patient. Patient agreed with plan.   Janalyn HarderSamantha Orien Mayhall, MHS, PA-C Rulon SeraSamantha F. Daiton Cowles, MHS, PA-C [facility]  [facility]  [address], [facility], [address]st [address] [place], [place] [address] (p):  [phone] [email or web address]

## 2018-11-05 ENCOUNTER — Telehealth: Payer: Self-pay | Admitting: Emergency Medicine

## 2018-11-05 NOTE — Telephone Encounter (Signed)
Left message following up on visit with [facility] 

## 2019-01-16 ENCOUNTER — Other Ambulatory Visit: Payer: Self-pay

## 2019-01-16 ENCOUNTER — Inpatient Hospital Stay: Payer: 59 | Attending: Oncology

## 2019-01-16 DIAGNOSIS — I10 Essential (primary) hypertension: Secondary | ICD-10-CM | POA: Diagnosis not present

## 2019-01-16 DIAGNOSIS — F329 Major depressive disorder, single episode, unspecified: Secondary | ICD-10-CM | POA: Diagnosis not present

## 2019-01-16 DIAGNOSIS — F17210 Nicotine dependence, cigarettes, uncomplicated: Secondary | ICD-10-CM | POA: Insufficient documentation

## 2019-01-16 DIAGNOSIS — K219 Gastro-esophageal reflux disease without esophagitis: Secondary | ICD-10-CM | POA: Diagnosis not present

## 2019-01-16 DIAGNOSIS — Z79899 Other long term (current) drug therapy: Secondary | ICD-10-CM | POA: Diagnosis not present

## 2019-01-16 LAB — CBC WITH DIFFERENTIAL/PLATELET
Abs Immature Granulocytes: 0.03 10*3/uL (ref 0.00–0.07)
Basophils Absolute: 0 10*3/uL (ref 0.0–0.1)
Basophils Relative: 1 %
Eosinophils Absolute: 0.1 10*3/uL (ref 0.0–0.5)
Eosinophils Relative: 1 %
HCT: 43.3 % (ref 36.0–46.0)
Hemoglobin: 14.6 g/dL (ref 12.0–15.0)
Immature Granulocytes: 0 %
Lymphocytes Relative: 27 %
Lymphs Abs: 2 10*3/uL (ref 0.7–4.0)
MCH: 31.8 pg (ref 26.0–34.0)
MCHC: 33.7 g/dL (ref 30.0–36.0)
MCV: 94.3 fL (ref 80.0–100.0)
Monocytes Absolute: 0.5 10*3/uL (ref 0.1–1.0)
Monocytes Relative: 6 %
Neutro Abs: 4.6 10*3/uL (ref 1.7–7.7)
Neutrophils Relative %: 65 %
Platelets: 189 10*3/uL (ref 150–400)
RBC: 4.59 MIL/uL (ref 3.87–5.11)
RDW: 12.4 % (ref 11.5–15.5)
WBC: 7.2 10*3/uL (ref 4.0–10.5)
nRBC: 0 % (ref 0.0–0.2)

## 2019-01-16 LAB — FERRITIN: Ferritin: 119 ng/mL (ref 11–307)

## 2019-01-17 ENCOUNTER — Encounter: Payer: Self-pay | Admitting: *Deleted

## 2019-01-22 ENCOUNTER — Telehealth: Payer: Self-pay | Admitting: *Deleted

## 2019-01-22 NOTE — Telephone Encounter (Signed)
Patient called me yest. After getting my message about her labs last week. She says since she has to get phlebotomy this week due to ferritin does she still need to see md and I checked and the answer is yes. So I called Helen Martinez back and left message that we will need to do video visit at 2 pm and then she will go directly to phlebotomy. Gave her my number to call me back. No extra lab needed since Thursday will be 7 days from lab work

## 2019-01-22 NOTE — Progress Notes (Signed)
She is coming in Thursday and having video call with you right before the infusion visit or phlebotomy

## 2019-01-23 ENCOUNTER — Telehealth: Payer: Self-pay | Admitting: Oncology

## 2019-01-24 ENCOUNTER — Inpatient Hospital Stay: Payer: 59

## 2019-01-24 ENCOUNTER — Inpatient Hospital Stay (HOSPITAL_BASED_OUTPATIENT_CLINIC_OR_DEPARTMENT_OTHER): Payer: 59 | Admitting: Oncology

## 2019-01-24 ENCOUNTER — Telehealth: Payer: Self-pay | Admitting: *Deleted

## 2019-01-24 ENCOUNTER — Other Ambulatory Visit: Payer: Self-pay

## 2019-01-24 ENCOUNTER — Encounter: Payer: Self-pay | Admitting: Oncology

## 2019-01-24 NOTE — Telephone Encounter (Signed)
I called the patient about her appt for next week. I got her voicemail and left message to come next week 5/27 at 1:15 for labs and 1:30 for phlebotomy.also in her message I left her the message to go into mychart and her username is [email or web address]. Then she can hit button for forgot password and create a new one and if it does not work then she can call me back and we will reset  It.

## 2019-01-24 NOTE — Progress Notes (Signed)
No concerns noted, no pain

## 2019-01-25 NOTE — Progress Notes (Signed)
I connected with Helen Martinez on 89/84/21 at  0:31 PM EDT by video enabled telemedicine visit and verified that I am speaking with the correct person using two identifiers.   I discussed the limitations, risks, security and privacy concerns of performing an evaluation and management service by telemedicine and the availability of in-person appointments. I also discussed with the patient that there may be a patient responsible charge related to this service. The patient expressed understanding and agreed to proceed.  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 She has not seen hematology before. Recent blood work from 10/27/2016 was as follows. CBC showed white count of 7.9, H&H of 14.8/42.3 with an MCV of 94 and a platelet count of 211. Iron studies showed iron saturation of 53% and ferritin of 559. Serum iron was elevated at 162. BMP was within normal limits. AST and ALT were mildly elevated at 45 and 47 respectively.  Patient states she was diagnosed with porphyria cutanea tarda about 20 something years ago around 1994 at that time she used to get recurrent blistering of her skin and she underwent 24-hour urine tests by a dermatologist who confirmed the diagnosis. This was in [place]. She was sent to primary care doctor to undergo phlebotomies but does not remember seeing the hematologist. Patient has not had any phlebotomies since 1994-95. Patient smokes about 1 pack of cigarettes per day and has been doing so over 20 years. She drinks about 2 glasses of wine every other day. She has never used birth control or estrogen supplements.  She has not been tested for hepatitis in the past. Currently patient states that she gets blistering of her skin very occasionally but it has not been much of an acute issue. Denies any symptoms of nausea, vomiting, abdominal pain or neurological symptoms such as seizures. Her father died about 20 years ago and she does not remember much of her family history   Her further work up was as follows:  cbc was normal. cmp showed mildly elevated AST of 44. ferritin was elevated at 414 and iron saturation was 33%. HIV, hep B and C testing was negative. 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 total plasma porphyrin elevated at 2.7 (upper limit of normal 1) but all the fractionated components were normal. quantitative urine porphobilinogen normal at 0.7. Free erythrocyte prophyrin and zinc protoporphyrin normal.   PORPHYRINS QUANTITATIVE URINE RANDOM  Uroporphyrins (UP) 311 [H ] ug/L BN  Reference Range: 0-20  Heptacarboxyl (7-CP) 163 [H ] ug/L BN  Reference Range: 0-2  Hexacarboxyl (6-CP) 3 [H ] ug/L BN  Reference Range: 0-1  Pentacarboxyl (5-CP) 122 [H ] ug/L BN  Reference Range: 0-2  Coproporphyrin (CP) I 135 [H ] ug/L BN  Reference Range: 0-15  Coproporphyrin (CP) III 27 ug/L BN  Reference Range: 0-49   Laboratory findings were consistent with porphyria cutanea tarda and patient was started on phlebotomy every 2 weeks.  Last phlebotomy was on 05/02/2017 when her ferritin was down to 10.  Interval history: She has been feeling more stressed at work especially during this time of COVID.  She quit work today and hopes to find alternative job.  Overall is doing well and denies any complaints at this time.  Denies any skin rash or blistering   Review of Systems  Constitutional: Negative for chills, fever, malaise/fatigue and weight loss.  HENT: Negative for congestion, ear discharge and nosebleeds.   Eyes: Negative for blurred vision.  Respiratory: Negative for cough,  hemoptysis, sputum production, shortness of breath and wheezing.   Cardiovascular: Negative for chest pain, palpitations, orthopnea and claudication.  Gastrointestinal: Negative for abdominal pain, blood in stool, constipation, diarrhea, heartburn, melena, nausea and vomiting.  Genitourinary: Negative for dysuria, flank pain, frequency, hematuria and urgency.  Musculoskeletal: Negative for back pain, joint pain and myalgias.  Skin: Negative for rash.  Neurological: Negative for dizziness, tingling, focal weakness, seizures, weakness and headaches.  Endo/Heme/Allergies: Does not bruise/bleed easily.  Psychiatric/Behavioral: Negative for depression and suicidal ideas. The patient does not have insomnia.     Allergies  Allergen Reactions  . Lisinopril Cough    Past Medical History:  Diagnosis Date  . Depression   . GERD (gastroesophageal reflux disease)   . Hypertension   . Porphyria cutanea tarda [facility])     Past Surgical History:  Procedure Laterality Date  . ABDOMINAL HYSTERECTOMY    . BREAST BIOPSY Right 06/14/2017   Benign  . OOPHORECTOMY      Social History   Socioeconomic History  . Marital status: Single    Spouse name: Not on file  . Number of children: 2  . Years of education: Not on file  . Highest education level: Not on file  Occupational History  . Occupation: LPN  Social Needs  . Financial resource strain: Not on file  . Food insecurity:    Worry: Not on file    Inability: Not on file  . Transportation needs:    Medical: Not on file    Non-medical: Not on file  Tobacco Use  . Smoking status: Current Every Day Smoker    Packs/day: 1.00    Types: Cigarettes  . Smokeless tobacco: Never Used  Substance and Sexual Activity  . Alcohol use: Yes    Comment: wine 2-3 times a week  . Drug use: No  . Sexual activity: Yes    Birth control/protection: None  Lifestyle  . Physical activity:    Days per week: Not on file    Minutes per session: Not on file   . Stress: Not on file  Relationships  . Social connections:    Talks on phone: Not on file    Gets together: Not on file    Attends religious service: Not on file    Active member of club or organization: Not on file    Attends meetings of clubs or organizations: Not on file    Relationship status: Not on file  . Intimate partner violence:    Fear of current or ex partner: Not on file    Emotionally abused: Not on file    Physically abused: Not on file    Forced sexual activity: Not on file  Other Topics Concern  . Not on file  Social History Narrative  . Not on file    Family History  Problem Relation Age of Onset  . Hypertension Mother   . Cancer Father        mouth then mets  . Breast cancer Maternal Aunt 54     Current Outpatient Medications:  .  albuterol (PROVENTIL HFA;VENTOLIN HFA) 108 (90 Base) MCG/ACT inhaler, Inhale 2 puffs into the lungs every 4 (  four) hours as needed., Disp: 1 Inhaler, Rfl: 0 .  amLODipine (NORVASC) 10 MG tablet, Take 10 mg by mouth daily., Disp: , Rfl:  .  hydrochlorothiazide (HYDRODIURIL) 25 MG tablet, Take 1 tablet by mouth daily., Disp: , Rfl: 2 .  ibuprofen (ADVIL,MOTRIN) 200 MG tablet, Take 800 mg by mouth every 6 (six) hours as needed for mild pain or moderate pain., Disp: , Rfl:  .  losartan (COZAAR) 50 MG tablet, Take 1 tablet by mouth daily., Disp: , Rfl: 2 .  omeprazole (PRILOSEC OTC) 20 MG tablet, Take 20 mg by mouth daily., Disp: , Rfl:  .  venlafaxine XR (EFFEXOR-XR) 150 MG 24 hr capsule, Take 150 mg by mouth daily with breakfast., Disp: , Rfl:   No results found.  No images are attached to the encounter.   CMP Latest Ref Rng & Units 08/01/2018  Glucose 70 - 99 mg/dL 161(W104(H)  BUN 6 - 20 mg/dL 13  Creatinine 9.600.44 - 4.541.00 mg/dL 0.980.50  Sodium 119135 - 147145 mmol/L 133(L)  Potassium 3.5 - 5.1 mmol/L 3.8  Chloride 98 - 111 mmol/L 99  CO2 22 - 32 mmol/L 26  Calcium 8.9 - 10.3 mg/dL 9.3  Total Protein 6.5 - 8.1 g/dL 7.4  Total  Bilirubin 0.3 - 1.2 mg/dL 0.5  Alkaline Phos 38 - 126 U/L 33(L)  AST 15 - 41 U/L 40  ALT 0 - 44 U/L 32   CBC Latest Ref Rng & Units 01/16/2019  WBC 4.0 - 10.5 K/uL 7.2  Hemoglobin 12.0 - 15.0 g/dL 82.914.6  Hematocrit 56.236.0 - 46.0 % 43.3  Platelets 150 - 400 K/uL 189     Observation/objective: Appears in no acute distress of a video visit today.  Breathing is nonlabored  Assessment and plan: Patient is a 48 year old female with a history of porphyria cutanea tarda on maintenance phlebotomy.  This is a routine follow-up visit for PCT  Patient was recently tested for Covid which came back negative.  She will proceed with phlebotomy next week given that her ferritin is greater than 100.  She will get phlebotomy every 2 weeks until ferritin is less than 100  Follow-up instructions: Repeat CBC and ferritin in 3 in 6 months and I will see her back in 6 months  I discussed the assessment and treatment plan with the patient. The patient was provided an opportunity to ask questions and all were answered. The patient agreed with the plan and demonstrated an understanding of the instructions.   The patient was advised to call back or seek an in-person evaluation if the symptoms worsen or if the condition fails to improve as anticipated.   Visit Diagnosis: 1. Porphyria cutanea tarda (HCC)     Dr. Owens SharkArchana , MD, MPH [facility] [phone] 01/25/2019 7:23 PM

## 2019-01-30 ENCOUNTER — Inpatient Hospital Stay: Payer: 59

## 2019-02-06 MED FILL — VENLAFAXINE HCL ER 150 MG C: 150 | 90 days supply | Qty: 90 | Fill #0

## 2019-02-11 MED FILL — HYDROCHLOROTHIAZIDE 25 MG T: 25 | 90 days supply | Qty: 90 | Fill #0

## 2019-02-11 MED FILL — LOSARTAN POTASSIUM 50 MG TA: 50 | 90 days supply | Qty: 90 | Fill #0

## 2019-02-11 MED FILL — AMLODIPINE BESYLATE 5 MG TA: 5 | 90 days supply | Qty: 90 | Fill #0

## 2019-02-14 ENCOUNTER — Inpatient Hospital Stay: Payer: Self-pay | Attending: Oncology

## 2019-02-14 ENCOUNTER — Inpatient Hospital Stay: Payer: Self-pay

## 2019-04-25 ENCOUNTER — Inpatient Hospital Stay: Payer: Self-pay | Attending: Oncology

## 2019-04-29 MED FILL — VENLAFAXINE HCL ER 150 MG C: 150 | 90 days supply | Qty: 90 | Fill #1

## 2019-05-01 MED FILL — AMLODIPINE BESYLATE 5 MG TA: 5 | 90 days supply | Qty: 90 | Fill #0

## 2019-05-01 MED FILL — LOSARTAN POTASSIUM 50 MG TA: 50 | 90 days supply | Qty: 90 | Fill #0

## 2019-05-01 MED FILL — HYDROCHLOROTHIAZIDE 25 MG T: 25 | 90 days supply | Qty: 90 | Fill #0

## 2019-08-06 ENCOUNTER — Telehealth: Payer: Self-pay | Admitting: Oncology

## 2019-08-06 ENCOUNTER — Inpatient Hospital Stay: Payer: Self-pay | Admitting: Oncology

## 2019-08-06 ENCOUNTER — Inpatient Hospital Stay: Payer: Self-pay | Attending: Oncology

## 2019-08-06 NOTE — Telephone Encounter (Signed)
LM to try and R/S NS appt °

## 2019-12-02 ENCOUNTER — Other Ambulatory Visit: Payer: Self-pay

## 2019-12-02 ENCOUNTER — Inpatient Hospital Stay
Admission: EM | Admit: 2019-12-02 | Discharge: 2019-12-05 | DRG: 872 | Disposition: A | Discharge status: Home / Self Care | Payer: No Typology Code available for payment source | Attending: Internal Medicine | Admitting: Internal Medicine

## 2019-12-02 ENCOUNTER — Encounter: Payer: Self-pay | Admitting: Emergency Medicine

## 2019-12-02 ENCOUNTER — Emergency Department: Payer: No Typology Code available for payment source

## 2019-12-02 DIAGNOSIS — R6520 Severe sepsis without septic shock: Secondary | ICD-10-CM | POA: Diagnosis not present

## 2019-12-02 DIAGNOSIS — I959 Hypotension, unspecified: Secondary | ICD-10-CM

## 2019-12-02 DIAGNOSIS — Z79899 Other long term (current) drug therapy: Secondary | ICD-10-CM | POA: Diagnosis not present

## 2019-12-02 DIAGNOSIS — Z803 Family history of malignant neoplasm of breast: Secondary | ICD-10-CM | POA: Diagnosis not present

## 2019-12-02 DIAGNOSIS — Z90710 Acquired absence of both cervix and uterus: Secondary | ICD-10-CM | POA: Diagnosis not present

## 2019-12-02 DIAGNOSIS — Z20822 Contact with and (suspected) exposure to covid-19: Secondary | ICD-10-CM | POA: Diagnosis present

## 2019-12-02 DIAGNOSIS — I9589 Other hypotension: Secondary | ICD-10-CM | POA: Diagnosis not present

## 2019-12-02 DIAGNOSIS — Z808 Family history of malignant neoplasm of other organs or systems: Secondary | ICD-10-CM

## 2019-12-02 DIAGNOSIS — K219 Gastro-esophageal reflux disease without esophagitis: Secondary | ICD-10-CM | POA: Diagnosis present

## 2019-12-02 DIAGNOSIS — E872 Acidosis: Secondary | ICD-10-CM | POA: Diagnosis present

## 2019-12-02 DIAGNOSIS — A419 Sepsis, unspecified organism: Secondary | ICD-10-CM | POA: Diagnosis present

## 2019-12-02 DIAGNOSIS — F17210 Nicotine dependence, cigarettes, uncomplicated: Secondary | ICD-10-CM | POA: Diagnosis present

## 2019-12-02 DIAGNOSIS — A020 Salmonella enteritis: Secondary | ICD-10-CM | POA: Diagnosis present

## 2019-12-02 DIAGNOSIS — Z888 Allergy status to other drugs, medicaments and biological substances status: Secondary | ICD-10-CM

## 2019-12-02 DIAGNOSIS — E860 Dehydration: Secondary | ICD-10-CM | POA: Diagnosis present

## 2019-12-02 DIAGNOSIS — K760 Fatty (change of) liver, not elsewhere classified: Secondary | ICD-10-CM | POA: Diagnosis present

## 2019-12-02 DIAGNOSIS — N179 Acute kidney failure, unspecified: Secondary | ICD-10-CM

## 2019-12-02 DIAGNOSIS — E876 Hypokalemia: Secondary | ICD-10-CM | POA: Diagnosis present

## 2019-12-02 DIAGNOSIS — Z23 Encounter for immunization: Secondary | ICD-10-CM

## 2019-12-02 DIAGNOSIS — F329 Major depressive disorder, single episode, unspecified: Secondary | ICD-10-CM | POA: Diagnosis present

## 2019-12-02 DIAGNOSIS — E861 Hypovolemia: Secondary | ICD-10-CM | POA: Diagnosis present

## 2019-12-02 DIAGNOSIS — Z8249 Family history of ischemic heart disease and other diseases of the circulatory system: Secondary | ICD-10-CM | POA: Diagnosis not present

## 2019-12-02 DIAGNOSIS — E871 Hypo-osmolality and hyponatremia: Secondary | ICD-10-CM | POA: Diagnosis present

## 2019-12-02 DIAGNOSIS — K529 Noninfective gastroenteritis and colitis, unspecified: Secondary | ICD-10-CM

## 2019-12-02 DIAGNOSIS — I10 Essential (primary) hypertension: Secondary | ICD-10-CM | POA: Diagnosis present

## 2019-12-02 DIAGNOSIS — R112 Nausea with vomiting, unspecified: Secondary | ICD-10-CM | POA: Diagnosis not present

## 2019-12-02 LAB — URINALYSIS, COMPLETE (UACMP) WITH MICROSCOPIC
Bilirubin Urine: NEGATIVE
Glucose, UA: NEGATIVE mg/dL
Hgb urine dipstick: NEGATIVE
Ketones, ur: NEGATIVE mg/dL
Leukocytes,Ua: NEGATIVE
Nitrite: NEGATIVE
Protein, ur: NEGATIVE mg/dL
Specific Gravity, Urine: 1.01 (ref 1.005–1.030)
pH: 5 (ref 5.0–8.0)

## 2019-12-02 LAB — CBC WITH DIFFERENTIAL/PLATELET
Abs Immature Granulocytes: 0.11 10*3/uL — ABNORMAL HIGH (ref 0.00–0.07)
Basophils Absolute: 0.1 10*3/uL (ref 0.0–0.1)
Basophils Relative: 1 %
Eosinophils Absolute: 0 10*3/uL (ref 0.0–0.5)
Eosinophils Relative: 0 %
HCT: 47.5 % — ABNORMAL HIGH (ref 36.0–46.0)
Hemoglobin: 16.9 g/dL — ABNORMAL HIGH (ref 12.0–15.0)
Immature Granulocytes: 1 %
Lymphocytes Relative: 11 %
Lymphs Abs: 1.3 10*3/uL (ref 0.7–4.0)
MCH: 31.7 pg (ref 26.0–34.0)
MCHC: 35.6 g/dL (ref 30.0–36.0)
MCV: 89.1 fL (ref 80.0–100.0)
Monocytes Absolute: 1.3 10*3/uL — ABNORMAL HIGH (ref 0.1–1.0)
Monocytes Relative: 11 %
Neutro Abs: 8.8 10*3/uL — ABNORMAL HIGH (ref 1.7–7.7)
Neutrophils Relative %: 76 %
Platelets: 278 10*3/uL (ref 150–400)
RBC: 5.33 MIL/uL — ABNORMAL HIGH (ref 3.87–5.11)
RDW: 12.2 % (ref 11.5–15.5)
WBC: 11.6 10*3/uL — ABNORMAL HIGH (ref 4.0–10.5)
nRBC: 0 % (ref 0.0–0.2)

## 2019-12-02 LAB — COMPREHENSIVE METABOLIC PANEL
ALT: 29 U/L (ref 0–44)
AST: 25 U/L (ref 15–41)
Albumin: 4 g/dL (ref 3.5–5.0)
Alkaline Phosphatase: 45 U/L (ref 38–126)
Anion gap: 23 — ABNORMAL HIGH (ref 5–15)
BUN: 74 mg/dL — ABNORMAL HIGH (ref 6–20)
CO2: 16 mmol/L — ABNORMAL LOW (ref 22–32)
Calcium: 8.4 mg/dL — ABNORMAL LOW (ref 8.9–10.3)
Chloride: 88 mmol/L — ABNORMAL LOW (ref 98–111)
Creatinine, Ser: 7.8 mg/dL — ABNORMAL HIGH (ref 0.44–1.00)
GFR calc Af Amer: 6 mL/min — ABNORMAL LOW (ref >60)
GFR calc non Af Amer: 5 mL/min — ABNORMAL LOW (ref >60)
Glucose, Bld: 174 mg/dL — ABNORMAL HIGH (ref 70–99)
Potassium: 3.2 mmol/L — ABNORMAL LOW (ref 3.5–5.1)
Sodium: 127 mmol/L — ABNORMAL LOW (ref 135–145)
Total Bilirubin: 0.7 mg/dL (ref 0.3–1.2)
Total Protein: 8.8 g/dL — ABNORMAL HIGH (ref 6.5–8.1)

## 2019-12-02 LAB — LACTIC ACID, PLASMA
Lactic Acid, Venous: 1.9 mmol/L (ref 0.5–1.9)
Lactic Acid, Venous: 1.5 mmol/L (ref 0.5–1.9)

## 2019-12-02 LAB — MAGNESIUM: Magnesium: 2.1 mg/dL (ref 1.7–2.4)

## 2019-12-02 LAB — POC SARS CORONAVIRUS 2 AG: SARS Coronavirus 2 Ag: NEGATIVE

## 2019-12-02 LAB — RESPIRATORY PANEL BY RT PCR (FLU A&B, COVID)
Influenza A by PCR: NEGATIVE
Influenza B by PCR: NEGATIVE
SARS Coronavirus 2 by RT PCR: NEGATIVE

## 2019-12-02 LAB — LIPASE, BLOOD: Lipase: 113 U/L — ABNORMAL HIGH (ref 11–51)

## 2019-12-02 LAB — PROCALCITONIN: Procalcitonin: 1.46 ng/mL

## 2019-12-02 LAB — TROPONIN I (HIGH SENSITIVITY)
Troponin I (High Sensitivity): 8 ng/L (ref <18)
Troponin I (High Sensitivity): 4 ng/L (ref <18)

## 2019-12-02 MED ORDER — LACTATED RINGERS IV BOLUS
1000.0000 mL | Freq: Once | INTRAVENOUS | Status: AC
  Administered 2019-12-02: 19:00:00 1000 mL via INTRAVENOUS

## 2019-12-02 MED ORDER — HEPARIN SODIUM (PORCINE) 5000 UNIT/ML IJ SOLN
5000.0000 [IU] | Freq: Two times a day (BID) | INTRAMUSCULAR | Status: DC
  Administered 2019-12-02 – 2019-12-05 (×5): 5000 [IU] via SUBCUTANEOUS
  Filled 2019-12-02 (×5): qty 1

## 2019-12-02 MED ORDER — PNEUMOCOCCAL VAC POLYVALENT 25 MCG/0.5ML IJ INJ
0.5000 mL | INJECTION | INTRAMUSCULAR | Status: AC
  Administered 2019-12-03: 13:00:00 0.5 mL via INTRAMUSCULAR
  Filled 2019-12-02: qty 0.5

## 2019-12-02 MED ORDER — VANCOMYCIN HCL IN DEXTROSE 1-5 GM/200ML-% IV SOLN
1000.0000 mg | Freq: Once | INTRAVENOUS | Status: AC
  Administered 2019-12-02: 21:00:00 1000 mg via INTRAVENOUS
  Filled 2019-12-02: qty 200

## 2019-12-02 MED ORDER — LACTATED RINGERS IV BOLUS
1000.0000 mL | Freq: Once | INTRAVENOUS | Status: AC
  Administered 2019-12-02: 1000 mL via INTRAVENOUS

## 2019-12-02 MED ORDER — ALBUTEROL SULFATE HFA 108 (90 BASE) MCG/ACT IN AERS: 2.0000 | INHALATION_SPRAY | RESPIRATORY_TRACT | Status: DC | PRN

## 2019-12-02 MED ORDER — SODIUM CHLORIDE 0.9 % IV SOLN
2.0000 g | Freq: Once | INTRAVENOUS | Status: AC
  Administered 2019-12-02: 2 g via INTRAVENOUS
  Filled 2019-12-02: qty 2

## 2019-12-02 MED ORDER — METRONIDAZOLE IN NACL 5-0.79 MG/ML-% IV SOLN
500.0000 mg | Freq: Once | INTRAVENOUS | Status: AC
  Administered 2019-12-02: 500 mg via INTRAVENOUS
  Filled 2019-12-02: qty 100

## 2019-12-02 MED ORDER — ONDANSETRON HCL 4 MG PO TABS: 4.0000 mg | ORAL_TABLET | Freq: Four times a day (QID) | ORAL | Status: DC | PRN

## 2019-12-02 MED ORDER — PANTOPRAZOLE SODIUM 20 MG PO TBEC
20.0000 mg | DELAYED_RELEASE_TABLET | Freq: Every day | ORAL | Status: DC
  Administered 2019-12-03 – 2019-12-05 (×3): 20 mg via ORAL
  Filled 2019-12-02 (×3): qty 1

## 2019-12-02 MED ORDER — ALBUTEROL SULFATE (2.5 MG/3ML) 0.083% IN NEBU: 2.5000 mg | INHALATION_SOLUTION | Freq: Four times a day (QID) | RESPIRATORY_TRACT | Status: DC | PRN

## 2019-12-02 MED ORDER — AMLODIPINE BESYLATE 5 MG PO TABS: 10.0000 mg | ORAL_TABLET | Freq: Every day | ORAL | Status: DC

## 2019-12-02 MED ORDER — VENLAFAXINE HCL ER 75 MG PO CP24
150.0000 mg | ORAL_CAPSULE | Freq: Every day | ORAL | Status: DC
  Administered 2019-12-03 – 2019-12-05 (×3): 150 mg via ORAL
  Filled 2019-12-02: qty 2
  Filled 2019-12-02: qty 1
  Filled 2019-12-02: qty 2

## 2019-12-02 MED ORDER — ACETAMINOPHEN 325 MG PO TABS: 650.0000 mg | ORAL_TABLET | Freq: Four times a day (QID) | ORAL | Status: DC | PRN

## 2019-12-02 MED ORDER — POTASSIUM CHLORIDE CRYS ER 20 MEQ PO TBCR
40.0000 meq | EXTENDED_RELEASE_TABLET | Freq: Once | ORAL | Status: AC
  Administered 2019-12-02: 40 meq via ORAL
  Filled 2019-12-02: qty 2

## 2019-12-02 MED ORDER — ONDANSETRON HCL 4 MG/2ML IJ SOLN: 4.0000 mg | Freq: Four times a day (QID) | INTRAMUSCULAR | Status: DC | PRN

## 2019-12-02 MED ORDER — LACTATED RINGERS IV BOLUS
500.0000 mL | Freq: Once | INTRAVENOUS | Status: AC
  Administered 2019-12-02: 22:00:00 500 mL via INTRAVENOUS

## 2019-12-02 MED ORDER — SODIUM CHLORIDE 0.9 % IV SOLN: INTRAVENOUS | Status: DC

## 2019-12-02 MED ORDER — ACETAMINOPHEN 650 MG RE SUPP: 650.0000 mg | Freq: Four times a day (QID) | RECTAL | Status: DC | PRN

## 2019-12-02 MED ORDER — MAGNESIUM HYDROXIDE 400 MG/5ML PO SUSP
30.0000 mL | Freq: Every day | ORAL | Status: DC | PRN
  Filled 2019-12-02: qty 30

## 2019-12-02 MED ORDER — TRAZODONE HCL 50 MG PO TABS: 25.0000 mg | ORAL_TABLET | Freq: Every evening | ORAL | Status: DC | PRN

## 2019-12-02 NOTE — ED Notes (Signed)
Report given to [facility], pt off to  R 108

## 2019-12-02 NOTE — ED Triage Notes (Signed)
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

## 2019-12-02 NOTE — ED Provider Notes (Signed)
[facility] Emergency Department Provider Note   ____________________________________________   First MD Initiated Contact with Patient 12/02/19 1631     (approximate)  I have reviewed the triage vital signs and the nursing notes.   HISTORY  Chief Complaint Emesis    HPI Helen Martinez is a 49 y.o. female with past medical history of hypertension and GERD who presents to the ED complaining of vomiting and diarrhea.  Patient reports that she initially started feeling bad about 5 days ago with fevers, vomiting, diarrhea, and generalized body aches.  She has also developed a nonproductive cough as well as some dyspnea with exertion.  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  She thought she was doing better over the weekend, when she states she stopped having fevers, but she started feeling bad again this morning with ongoing diarrhea and lightheadedness.  She denies any abdominal pain, dysuria, hematuria, or chest pain.        Past Medical History:  Diagnosis Date  . Depression   . GERD (gastroesophageal reflux disease)   . Hypertension   . Porphyria cutanea tarda [facility])     Patient Active Problem List   Diagnosis Date Noted  . AKI (acute kidney injury) (HCC) 12/02/2019  . Porphyria cutanea tarda (HCC) 01/08/2017  . Anal fissure 06/02/2016  . Rectal pain 06/02/2016  . Loose stools 06/02/2016  . Hypertensive disorder 09/23/2015  . Contusion of lower back 09/18/2013    Past Surgical History:  Procedure Laterality Date  . ABDOMINAL HYSTERECTOMY    . BREAST BIOPSY Right 06/14/2017   Benign  . OOPHORECTOMY      Prior to Admission medications   Medication Sig Start Date End Date Taking? Authorizing Provider  albuterol (PROVENTIL HFA;VENTOLIN HFA) 108 (90 Base) MCG/ACT inhaler Inhale 2 puffs into the lungs every 4 (four) hours as needed. 09/13/18  Yes Janalyn Harder, PA-C    amLODipine (NORVASC) 10 MG tablet Take 10 mg by mouth daily.   Yes [provider]  hydrochlorothiazide (HYDRODIURIL) 25 MG tablet Take 1 tablet by mouth daily. 03/23/16  Yes [provider]  ibuprofen (ADVIL,MOTRIN) 200 MG tablet Take 800 mg by mouth every 6 (six) hours as needed for mild pain or moderate pain.   Yes [provider]  losartan (COZAAR) 50 MG tablet Take 1 tablet by mouth daily. 02/22/16  Yes [provider]  omeprazole (PRILOSEC OTC) 20 MG tablet Take 20 mg by mouth daily.   Yes [provider]  venlafaxine XR (EFFEXOR-XR) 150 MG 24 hr capsule Take 150 mg by mouth daily with breakfast.   Yes [provider]    Allergies Lisinopril  Family History  Problem Relation Age of Onset  . Hypertension Mother   . Cancer Father        mouth then mets  . Breast cancer Maternal Aunt 37    Social History Social History   Tobacco Use  . Smoking status: Current Every Day Smoker    Packs/day: 1.00    Types: Cigarettes  . Smokeless tobacco: Never Used  Substance Use Topics  . Alcohol use: Yes    Comment: wine 2-3 times a week  . Drug use: No    Review of Systems  Constitutional: No fever/chills.  Positive for lightheadedness. Eyes: No visual changes. ENT: No sore throat. Cardiovascular: Denies chest pain. Respiratory: Positive for cough and shortness of breath.  Gastrointestinal: No abdominal pain.  Positive for nausea and vomiting.  Positive for diarrhea.  No constipation. Genitourinary: Negative for dysuria. Musculoskeletal: Negative for back pain. Skin: Negative for rash. Neurological: Negative for headaches, focal weakness or numbness.  ____________________________________________   PHYSICAL EXAM:  VITAL SIGNS: ED Triage Vitals  Enc Vitals Group     BP 12/02/19 1629 (!) 74/49     Pulse Rate 12/02/19 1629 (!) 103     Resp 12/02/19 1629 18     Temp 12/02/19 1634 (!) 96.9 F (36.1 C)     Temp Source  12/02/19 1634 Axillary     SpO2 12/02/19 1629 99 %     Weight 12/02/19 1626 180 lb (81.6 kg)     Height 12/02/19 1626 5\' 2"  (1.575 m)     Head Circumference --      Peak Flow --      Pain Score 12/02/19 1626 10     Pain Loc --      Pain Edu? --      Excl. in GC? --    Constitutional: Alert and oriented. Eyes: Conjunctivae are normal. Head: Atraumatic. Nose: No congestion/rhinnorhea. Mouth/Throat: Mucous membranes are dry. Neck: Normal ROM Cardiovascular: Tachycardic, regular rhythm. Grossly normal heart sounds. Respiratory: Normal respiratory effort.  No retractions. Lungs CTAB. Gastrointestinal: Soft and nontender. No distention. Genitourinary: deferred Musculoskeletal: No lower extremity tenderness nor edema. Neurologic:  Normal speech and language. No gross focal neurologic deficits are appreciated. Skin:  Skin is warm, dry and intact. No rash noted. Psychiatric: Mood and affect are normal. Speech and behavior are normal.  ____________________________________________   LABS (all labs ordered are listed, but only abnormal results are displayed)  Labs Reviewed  COMPREHENSIVE METABOLIC PANEL - Abnormal; Notable for the following components:      Result Value   Sodium 127 (*)    Potassium 3.2 (*)    Chloride 88 (*)    CO2 16 (*)    Glucose, Bld 174 (*)    BUN 74 (*)    Creatinine, Ser 7.80 (*)    Calcium 8.4 (*)    Total Protein 8.8 (*)    GFR calc non Af Amer 5 (*)    GFR calc Af Amer 6 (*)    Anion gap 23 (*)    All other components within normal limits  CBC WITH DIFFERENTIAL/PLATELET - Abnormal; Notable for the following components:   WBC 11.6 (*)    RBC 5.33 (*)    Hemoglobin 16.9 (*)    HCT 47.5 (*)    Neutro Abs 8.8 (*)    Monocytes Absolute 1.3 (*)    Abs Immature Granulocytes 0.11 (*)    All other components within normal limits  LIPASE, BLOOD - Abnormal; Notable for the following components:   Lipase 113 (*)    All other components within normal  limits  RESPIRATORY PANEL BY RT PCR (FLU A&B, COVID)  CULTURE, BLOOD (ROUTINE X 2)  CULTURE, BLOOD (ROUTINE X 2)  URINE CULTURE  GI PATHOGEN PANEL BY PCR, STOOL  C DIFFICILE QUICK SCREEN W PCR REFLEX  STOOL CULTURE  PROCALCITONIN  LACTIC ACID, PLASMA  LACTIC ACID, PLASMA  MAGNESIUM  URINALYSIS, COMPLETE (UACMP) WITH MICROSCOPIC  PROCALCITONIN  HIV ANTIBODY (ROUTINE TESTING W REFLEX)  BASIC METABOLIC PANEL  CBC  LACTOFERRIN, FECAL,QUALITATIVE  POC SARS CORONAVIRUS 2 AG -  ED  POC SARS CORONAVIRUS 2 AG  TROPONIN I (HIGH SENSITIVITY)  TROPONIN I (HIGH SENSITIVITY)   ____________________________________________  EKG  ED ECG REPORT I, Blake Divine, the attending physician, personally viewed and interpreted this ECG.   Date: 12/02/2019  EKG Time: 16:36  Rate: 101  Rhythm: sinus tachycardia  Axis: Normal  Intervals:Prolonged QT  ST&T Change: None   PROCEDURES  Procedure(s) performed (including Critical Care):  .1-3 Lead EKG Interpretation Performed by: Blake Divine, MD Authorized by: Blake Divine, MD     Interpretation: normal     ECG rate:  105   ECG rate assessment: tachycardic     Rhythm: sinus tachycardia     Ectopy: none     Conduction: normal    .Critical Care Performed by: Blake Divine, MD Authorized by: Blake Divine, MD   Critical care provider statement:    Critical care time (minutes):  45   Critical care time was exclusive of:  Separately billable procedures and treating other patients and teaching time   Critical care was necessary to treat or prevent imminent or life-threatening deterioration of the following conditions:  Sepsis   Critical care was time spent personally by me on the following activities:  Discussions with consultants, evaluation of patient's response to treatment, examination of patient, ordering and performing treatments and interventions, ordering and review of laboratory studies, ordering and review of  radiographic studies, pulse oximetry, re-evaluation of patient's condition, obtaining history from patient or surrogate and review of old charts   I assumed direction of critical care for this patient from another provider in my specialty: no       ____________________________________________   INITIAL IMPRESSION / ASSESSMENT AND PLAN / ED COURSE       49 year old female presents to the ED with fever, malaise, vomiting, diarrhea, and diffuse body aches for the past 5 days.  She is afebrile here but tachycardic and hypotensive.  I suspect this is secondary to dehydration and sepsis seems less likely as there is no obvious bacterial infection, symptoms seem more likely to be viral in etiology.  We will perform point-of-care COVID-19 testing, assess for potential bacterial infectious process with UA and chest x-ray, but hold off on antibiotics for now.  We will hydrate with IV fluids and closely monitor vital signs, no abdominal tenderness to suggest intra-abdominal pathology.  Both point-of-care testing and follow-up PCR are negative for COVID-19.  Patient's vital signs remained improved, but her procalcitonin found to be elevated.  Lab work also significant for acute renal failure with creatinine of greater than 7, potassium within normal limits.  This is likely due to dehydration and we will continue to hydrate with IV fluids.  Given no obvious source of infection, patient was started on broad-spectrum antibiotics.  Case discussed with hospitalist for admission.      ____________________________________________   FINAL CLINICAL IMPRESSION(S) / ED DIAGNOSES  Final diagnoses:  Acute renal failure, unspecified acute renal failure type ([place])  Dehydration  Sepsis with acute renal failure without septic shock, due to unspecified organism, unspecified acute renal failure type [facility])     ED Discharge Orders    None       Note:  This document was prepared using Dragon voice recognition  software and may include unintentional dictation errors.   Blake Divine, MD 12/02/19 [phone]

## 2019-12-02 NOTE — Progress Notes (Signed)
PHARMACY -  BRIEF ANTIBIOTIC NOTE   Pharmacy has received consult(s) for vanc and cefepime from an ED provider.  The patient's profile has been reviewed for ht/wt/allergies/indication/available labs.    One time order(s) placed for vanc 1 g + cefepime 2 g  Further antibiotics/pharmacy consults should be ordered by admitting physician if indicated.                       Thank you,  Pricilla Riffle, PharmD 12/02/2019  6:53 PM

## 2019-12-02 NOTE — H&P (Addendum)
[place] [facility] [place] NAME: Helen Martinez    MR#:  [id number]  DATE OF BIRTH:  April 27, 1971  DATE OF ADMISSION:  12/02/2019  PRIMARY CARE PHYSICIAN: Gennette Pac, FNP   REQUESTING/REFERRING PHYSICIAN: Blake Divine, MD  CHIEF COMPLAINT:   Chief Complaint  Patient presents with  . Emesis    HISTORY OF PRESENT ILLNESS:  Helen Martinez  is a 49 y.o. Caucasian female with a known history of depression, hypertension and GERD, who presented to the emergency room with acute onset of intractable nausea and vomiting since Wednesday with associated diarrhea.  She admits to fever that was up to 102.  She has been having mild dyspnea mainly on exertion without chest pain.  She is a Covid test her but at her second dose of Pfizer COVID-19 vaccine more than a month ago.  She denies any bilious vomitus or hematemesis or melena or bright red bleeding per rectum.  No dysuria, oliguria or hematuria or flank pain.  She has been having dry heaves since yesterday.  Over the last week she stated that she only kept down a banana, steak and soup.  She continues to take as needed ibuprofen and for her hypertension she takes Losartan and HCTZ.  Upon presentation to the emergency room, blood pressure was 74/49 with a pulse of 103 with attention 96.9.  Later blood pressure was 98/65 with heart rate of 84.  Labs revealed hyponatremia 127, hypokalemia 32, hypochloremia of 88 and blood glucose of 174.  BUN and creatinine were 74/7.8 compared to 13 and 0.5 on 11/27 2019.  Anion gap was 23 and magnesium 2.1.  LFTs were unremarkable except for total protein of 8.8 with albumin of 4.  High-sensitivity troponin I was 8 and later 4.  CBC showed mild leukocytosis of 11.6 with neutrophilia and hemoconcentration.  Lactic acid level was 1.9 later 1.5 and procalcitonin 1.46.  Influenza antigens and COVID-19 PCR came back negative.  The patient was given IV cefepime and vancomycin as well as Flagyl and 2 L  of lactated ringer.  The patient will be admitted to the medical monitored bed for further evaluation and management.  PAST MEDICAL HISTORY:   Past Medical History:  Diagnosis Date  . Depression   . GERD (gastroesophageal reflux disease)   . Hypertension   . 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 ABDOMINAL HYSTERECTOMY    . BREAST BIOPSY Right 06/14/2017   Benign  . OOPHORECTOMY      SOCIAL HISTORY:   Social History   Tobacco Use  . Smoking status: Current Every Day Smoker    Packs/day: 1.00    Types: Cigarettes  . Smokeless tobacco: Never Used  Substance Use Topics  . Alcohol use: Yes    Comment: wine 2-3 times a week    FAMILY HISTORY:   Family History  Problem Relation Age of Onset  . Hypertension Mother   . Cancer Father        mouth then mets  . Breast cancer Maternal Aunt 45    DRUG ALLERGIES:   Allergies  Allergen Reactions  . Lisinopril Cough    REVIEW OF SYSTEMS:   ROS As per history of present illness. All pertinent systems were reviewed above. Constitutional,  HEENT, cardiovascular, respiratory, GI, GU, musculoskeletal, neuro, psychiatric, endocrine,  integumentary and hematologic systems were reviewed and are otherwise  negative/unremarkable except for positive  findings mentioned above in the HPI.   MEDICATIONS AT HOME:   Prior to Admission medications   Medication Sig Start Date End Date Taking? Authorizing Provider  albuterol (PROVENTIL HFA;VENTOLIN HFA) 108 (90 Base) MCG/ACT inhaler Inhale 2 puffs into the lungs every 4 (four) hours as needed. 09/13/18   Janalyn Harder, PA-C  amLODipine (NORVASC) 10 MG tablet Take 10 mg by mouth daily.    [provider]  hydrochlorothiazide (HYDRODIURIL) 25 MG tablet Take 1 tablet by mouth daily. 03/23/16   [provider]  ibuprofen (ADVIL,MOTRIN) 200 MG tablet Take 800 mg by mouth every 6 (six) hours as  needed for mild pain or moderate pain.    [provider]  losartan (COZAAR) 50 MG tablet Take 1 tablet by mouth daily. 02/22/16   [provider]  omeprazole (PRILOSEC OTC) 20 MG tablet Take 20 mg by mouth daily.    [provider]  venlafaxine XR (EFFEXOR-XR) 150 MG 24 hr capsule Take 150 mg by mouth daily with breakfast.    [provider]      VITAL SIGNS:  Blood pressure 98/65, pulse 77, temperature 98.3 F (36.8 C), temperature source Oral, resp. rate 20, height 5\' 2"  (1.575 m), weight 81.6 kg, SpO2 100 %.  PHYSICAL EXAMINATION:  Physical Exam  GENERAL:  49 y.o.-year-old Caucasian female patient lying in the bed with no acute distress.  EYES: Pupils equal, round, reactive to light and accommodation. No scleral icterus. Extraocular muscles intact.  HEENT: Head atraumatic, normocephalic. Oropharynx and nasopharynx clear.  NECK:  Supple, no jugular venous distention. No thyroid enlargement, no tenderness.  LUNGS: Normal breath sounds bilaterally, no wheezing, rales,rhonchi or crepitation. No use of accessory muscles of respiration.  CARDIOVASCULAR: Regular rate and rhythm, S1, S2 normal. No murmurs, rubs, or gallops.  ABDOMEN: Soft, nondistended, nontender. Bowel sounds present. No organomegaly or mass.  EXTREMITIES: No pedal edema, cyanosis, or clubbing.  NEUROLOGIC: Cranial nerves II through XII are intact. Muscle strength 5/5 in all extremities. Sensation intact. Gait not checked.  PSYCHIATRIC: The patient is alert and oriented x 3.  Normal affect and good eye contact. SKIN: No obvious rash, lesion, or ulcer.   LABORATORY PANEL:   CBC Recent Labs  Lab 12/02/19 1648  WBC 11.6*  HGB 16.9*  HCT 47.5*  PLT 278   ------------------------------------------------------------------------------------------------------------------  Chemistries  Recent Labs  Lab 12/02/19 1648 12/02/19 1852  NA 127*  --   K 3.2*  --   CL 88*  --   CO2 16*   --   GLUCOSE 174*  --   BUN 74*  --   CREATININE 7.80*  --   CALCIUM 8.4*  --   MG  --  2.1  AST 25  --   ALT 29  --   ALKPHOS 45  --   BILITOT 0.7  --    ------------------------------------------------------------------------------------------------------------------  Cardiac Enzymes No results for input(s): TROPONINI in the last 168 hours. ------------------------------------------------------------------------------------------------------------------  RADIOLOGY:  DG Chest Portable 1 View  Result Date: 12/02/2019 CLINICAL DATA:  Shortness of breath EXAM: PORTABLE CHEST 1 VIEW COMPARISON:  2017 FINDINGS: The heart size and mediastinal contours are within normal limits. Both lungs are clear. No pleural effusion or pneumothorax. The visualized skeletal structures are unremarkable. IMPRESSION: No active disease. Electronically Signed   By: 2018 M.D.   On: 12/02/2019 17:05      IMPRESSION AND PLAN:   1.  Acute kidney injury.  This is likely prerenal and multifactorial  secondary to intractable nausea and vomiting with subsequent dehydration as well as diuretic therapy with HCTZ as well as ARB therapy and NSAIDs. -The patient will be admitted to a medical monitored bed. -We will continue hydration with IV normal saline. -We will obtain bilateral renal ultrasound. -Nephrology consultation will be obtained.  I will notify Dr. Cherylann Ratel about the patient.   -We will stop nephrotoxins including Cozaar, NSAIDs and HCTZ. -We will follow serial BMPs.  2.  Acute gastroenteritis with subsequent sepsis as manifested by hypotension and tachycardia as well as leukocytosis.  He has no severe sepsis or shock. -Differential diagnosis including infectious versus viral. -Given associated sepsis, will continue antibiotic therapy with IV Rocephin and Flagyl. -Stool studies were ordered in the ER including C. difficile.  3.  Hypotension with history of hypertension. -We will hold all his  antihypertensives.  With improvement of his blood pressure, Norvasc can be continued with holding parameters.  4.  Hypokalemia, hyponatremia and hypochloremia. -This is clearly secondary to dehydration and volume depletion. -Potassium will be replaced the patient will be hydrated with IV normal saline. -We will monitor her BMP.  5.  Depression. -Effexor XR will be continued.  6.  DVT prophylaxis. -Subcutaneous Lovenox.  All the records are reviewed and case discussed with ED provider. The plan of care was discussed in details with the patient (and family). I answered all questions. The patient agreed to proceed with the above mentioned plan. Further management will depend upon hospital course.   CODE STATUS: Full code.  The patient is admitted to inpatient status due to the intensity of medical problems, potential risks involved and management requirement that will necessitate more than 2 midnights.  TOTAL TIME TAKING CARE OF THIS PATIENT: 55 minutes.    Hannah Beat M.D on 12/02/2019 at 8:24 PM  [facility]   From 7 PM-7 AM, contact night-coverage [email or web address]  CC: Primary care physician; Toy Cookey, FNP   Note: This dictation was prepared with Dragon dictation along with smaller phrase technology. Any transcriptional errors that result from this process are unintentional.

## 2019-12-02 NOTE — Progress Notes (Signed)
CODE SEPSIS - PHARMACY COMMUNICATION  **Broad Spectrum Antibiotics should be administered within 1 hour of Sepsis diagnosis**  Time Code Sepsis Called/Page Received: 1847  Antibiotics Ordered: vanc/cefepime/metronidazole  Time of 1st antibiotic administration: 2002  Additional action taken by pharmacy: NA  If necessary, Name of Provider/Nurse Contacted: NA    Pricilla Riffle ,PharmD Clinical Pharmacist  12/02/2019  8:11 PM

## 2019-12-03 ENCOUNTER — Inpatient Hospital Stay: Payer: No Typology Code available for payment source

## 2019-12-03 LAB — URINE CULTURE: Culture: NO GROWTH

## 2019-12-03 LAB — C DIFFICILE QUICK SCREEN W PCR REFLEX
C Diff antigen: NEGATIVE
C Diff interpretation: NOT DETECTED
C Diff toxin: NEGATIVE

## 2019-12-03 LAB — CBC
HCT: 39.8 % (ref 36.0–46.0)
Hemoglobin: 14.1 g/dL (ref 12.0–15.0)
MCH: 32.2 pg (ref 26.0–34.0)
MCHC: 35.4 g/dL (ref 30.0–36.0)
MCV: 90.9 fL (ref 80.0–100.0)
Platelets: 206 10*3/uL (ref 150–400)
RBC: 4.38 MIL/uL (ref 3.87–5.11)
RDW: 11.9 % (ref 11.5–15.5)
WBC: 7.1 10*3/uL (ref 4.0–10.5)
nRBC: 0 % (ref 0.0–0.2)

## 2019-12-03 LAB — BASIC METABOLIC PANEL
Anion gap: 14 (ref 5–15)
BUN: 68 mg/dL — ABNORMAL HIGH (ref 6–20)
CO2: 17 mmol/L — ABNORMAL LOW (ref 22–32)
Calcium: 8.1 mg/dL — ABNORMAL LOW (ref 8.9–10.3)
Chloride: 100 mmol/L (ref 98–111)
Creatinine, Ser: 3.92 mg/dL — ABNORMAL HIGH (ref 0.44–1.00)
GFR calc Af Amer: 15 mL/min — ABNORMAL LOW (ref >60)
GFR calc non Af Amer: 13 mL/min — ABNORMAL LOW (ref >60)
Glucose, Bld: 127 mg/dL — ABNORMAL HIGH (ref 70–99)
Potassium: 3.3 mmol/L — ABNORMAL LOW (ref 3.5–5.1)
Sodium: 131 mmol/L — ABNORMAL LOW (ref 135–145)

## 2019-12-03 LAB — PROCALCITONIN: Procalcitonin: 0.34 ng/mL

## 2019-12-03 LAB — LACTOFERRIN, FECAL, QUALITATIVE: Lactoferrin, Fecal, Qual: POSITIVE — AB

## 2019-12-03 LAB — HIV ANTIBODY (ROUTINE TESTING W REFLEX): HIV Screen 4th Generation wRfx: NONREACTIVE

## 2019-12-03 MED ORDER — POTASSIUM CHLORIDE CRYS ER 20 MEQ PO TBCR
40.0000 meq | EXTENDED_RELEASE_TABLET | Freq: Four times a day (QID) | ORAL | Status: AC
  Administered 2019-12-03 (×2): 40 meq via ORAL
  Filled 2019-12-03 (×2): qty 2

## 2019-12-03 MED ORDER — METRONIDAZOLE IN NACL 5-0.79 MG/ML-% IV SOLN
500.0000 mg | Freq: Three times a day (TID) | INTRAVENOUS | Status: DC
  Administered 2019-12-03 – 2019-12-05 (×6): 500 mg via INTRAVENOUS
  Filled 2019-12-03 (×10): qty 100

## 2019-12-03 MED ORDER — SODIUM CHLORIDE 0.9 % IV SOLN
2.0000 g | INTRAVENOUS | Status: DC
  Administered 2019-12-03 – 2019-12-05 (×3): 2 g via INTRAVENOUS
  Filled 2019-12-03: qty 20
  Filled 2019-12-03 (×3): qty 2

## 2019-12-03 MED ORDER — POTASSIUM CHLORIDE 20 MEQ PO PACK
40.0000 meq | PACK | Freq: Once | ORAL | Status: AC
  Administered 2019-12-03: 02:00:00 40 meq via ORAL
  Filled 2019-12-03: qty 2

## 2019-12-03 NOTE — Progress Notes (Signed)
PROGRESS NOTE    [name]  UXN:[id number] DOB: 01/08/1971 DOA: 12/02/2019  PCP: Toy Cookey, FNP    LOS - 1   Brief Narrative:  Helen Martinez  is a 49 y.o. Caucasian female with a known history of depression, hypertension and GERD, who presented to the ED on 3/29 with intractable nausea and vomiting since 5 days earlier.  Associated symptoms of diarrhea, nausea/vomiting followed by dry heaves, fevers with Tmax 102 at home, dyspnea on exertion, and not tolerating PO intake.  She was taking ibuprofen every 4 hours for her fevers.  In the ED, hypotensive 74/49, tachycardic 103, temp 96.9.  Labs November sodium 127, potassium 3.2, chloride 88, glucose 174, BUN 74, creatinine 7.8, anion gap 23.  CBC showed mild leukocytosis 11.6.  Patient tested negative for influenza A and B as well as COVID-19.  She was treated with broad-spectrum antibiotics in the ED as well as IV fluid resuscitation and admitted to hospitalist service for further evaluation and management.  Subjective 3/30: Patient seen at bedside this morning.  No acute events reported overnight.  She reports no sleep overnight secondary to frequent diarrhea.  Reports its become less frequent today.  She is tolerating diet.  Denies nausea or vomiting, fevers or chills.  Assessment & Plan:   Active Problems:   AKI (acute kidney injury) (HCC)   Acute Kidney Injury -improving with IV hydration.   Presented with creatinine 7.80 >> 3.92 today. Multifactorial secondary to dehydration, ibuprofen use as well as Cozaar and HCTZ. Renal ultrasound showed normal kidneys, hepatic steatosis. --Continue IV fluids --Avoid nephrotoxins and hypotension -Cozaar and HCTZ are held --Absolutely no NSAIDs --Renally dose meds as indicated --Monitor renal function and electrolytes --Nephrology consulted  Sepsis secondary to acute gastroenteritis -sepsis present on admission with hypotension, tachycardia, leukocytosis and acute illness potentially  infectious but unclear at this time.  Etiology unclear.  Was fluid resuscitated and started on broad-spectrum antibiotics in the ED. C. difficile is negative. --Follow-up GI pathogen panel and stool cultures --Continue IV hydration --Continue Rocephin and Flagyl  Hypotension -present on admission in setting of sepsis. History of essential hypertension --Hold home antihypertensives --IV hydration as above  Anion gap metabolic acidosis Hypokalemia, hyponatremia, hypochloremia -all secondary to dehydration hypovolemia. All have improved since admission with IV fluids. --Replace potassium --Monitor electrolytes closely, replace K if less than 4.0 --Check BMP and magnesium daily  Depression -continue home Effexor   DVT prophylaxis: Lovenox   Code Status: Full Code  Family Communication: None at bedside during encounter, patient to update  Disposition Plan: Expect home in 24 to 48 hours pending further improvement in renal function. Coming From home, independent Exp DC Date 3/31 -4/1 Barriers renal function Medically Stable for Discharge?  No  Consultants:   Nephrology  Procedures:   None  Antimicrobials:   Cefepime, vancomycin, Flagyl >> in ED 3/29  Rocephin, Flagyl 3/30 >>   Objective: Vitals:   12/02/19 2230 12/02/19 2331 12/03/19 0500 12/03/19 0600  BP: 102/62 93/63  94/60  Pulse: 79 83 76   Resp: 16 19 18    Temp: 98.6 F (37 C) 97.7 F (36.5 C)    TempSrc: Oral Oral    SpO2: 99% 100% 99%   Weight:      Height:        Intake/Output Summary (Last 24 hours) at 12/03/2019 0721 Last data filed at 12/03/2019 0300 Gross per 24 hour  Intake 666.67 ml  Output --  Net 666.67 ml   12/05/2019  Weights   12/02/19 1626  Weight: 81.6 kg    Examination:  General exam: awake, alert, no acute distress HEENT: atraumatic, clear conjunctiva, anicteric sclera, moist mucus membranes, hearing grossly normal  Respiratory system: CTAB, no wheezes, rales or rhonchi, normal  respiratory effort. Cardiovascular system: normal S1/S2, RRR, no JVD, murmurs, rubs, gallops, no pedal edema.   Gastrointestinal system: soft, NT, ND, no HSM felt, +bowel sounds. Central nervous system: A&O x4. no gross focal neurologic deficits, normal speech Extremities: moves all, no edema, normal tone Skin: dry, intact, normal temperature, normal color Psychiatry: normal mood, congruent affect, judgement and insight appear normal    Data Reviewed: I have personally reviewed following labs and imaging studies  CBC: Recent Labs  Lab 12/02/19 1648 12/03/19 0443  WBC 11.6* 7.1  NEUTROABS 8.8*  --   HGB 16.9* 14.1  HCT 47.5* 39.8  MCV 89.1 90.9  PLT 278 206   Basic Metabolic Panel: Recent Labs  Lab 12/02/19 1648 12/02/19 1852 12/03/19 0443  NA 127*  --  131*  K 3.2*  --  3.3*  CL 88*  --  100  CO2 16*  --  17*  GLUCOSE 174*  --  127*  BUN 74*  --  68*  CREATININE 7.80*  --  3.92*  CALCIUM 8.4*  --  8.1*  MG  --  2.1  --    GFR: Estimated Creatinine Clearance: 17 mL/min (A) (by C-G formula based on SCr of 3.92 mg/dL (H)). Liver Function Tests: Recent Labs  Lab 12/02/19 1648  AST 25  ALT 29  ALKPHOS 45  BILITOT 0.7  PROT 8.8*  ALBUMIN 4.0   Recent Labs  Lab 12/02/19 1648  LIPASE 113*   No results for input(s): AMMONIA in the last 168 hours. Coagulation Profile: No results for input(s): INR, PROTIME in the last 168 hours. Cardiac Enzymes: No results for input(s): CKTOTAL, CKMB, CKMBINDEX, TROPONINI in the last 168 hours. BNP (last 3 results) No results for input(s): PROBNP in the last 8760 hours. HbA1C: No results for input(s): HGBA1C in the last 72 hours. CBG: No results for input(s): GLUCAP in the last 168 hours. Lipid Profile: No results for input(s): CHOL, HDL, LDLCALC, TRIG, CHOLHDL, LDLDIRECT in the last 72 hours. Thyroid Function Tests: No results for input(s): TSH, T4TOTAL, FREET4, T3FREE, THYROIDAB in the last 72 hours. Anemia Panel: No  results for input(s): VITAMINB12, FOLATE, FERRITIN, TIBC, IRON, RETICCTPCT in the last 72 hours. Sepsis Labs: Recent Labs  Lab 12/02/19 1648 12/02/19 1649 12/02/19 1852 12/03/19 0443  PROCALCITON 1.46  --   --  0.34  LATICACIDVEN  --  1.9 1.5  --     Recent Results (from the past 240 hour(s))  Culture, blood (routine x 2)     Status: None (Preliminary result)   Collection Time: 12/02/19  4:47 PM   Specimen: BLOOD  Result Value Ref Range Status   Specimen Description BLOOD LEFT ANTECUBITAL  Final   Special Requests   Final    BOTTLES DRAWN AEROBIC AND ANAEROBIC Blood Culture adequate volume   Culture   Final    NO GROWTH < 24 HOURS Performed at [facility], [address] [place], [place] [address]    Report Status PENDING  Incomplete  Culture, blood (routine x 2)     Status: None (Preliminary result)   Collection Time: 12/02/19  4:49 PM   Specimen: BLOOD  Result Value Ref Range Status   Specimen Description BLOOD BLOOD RIGHT ARM  Final   Special Requests   Final    BOTTLES DRAWN AEROBIC AND ANAEROBIC Blood Culture adequate volume   Culture   Final    NO GROWTH < 24 HOURS Performed at [facility], [address] [place], [place] [address]    Report Status PENDING  Incomplete  Respiratory Panel by RT PCR (Flu A&B, Covid) - Nasopharyngeal Swab     Status: None   Collection Time: 12/02/19  6:41 PM   Specimen: Nasopharyngeal Swab  Result Value Ref Range Status   SARS Coronavirus 2 by RT PCR NEGATIVE NEGATIVE Final    Comment: (NOTE) SARS-CoV-2 target nucleic acids are NOT DETECTED. The SARS-CoV-2 RNA is generally detectable in upper respiratoy specimens during the acute phase of infection. The lowest concentration of SARS-CoV-2 viral copies this assay can detect is 131 copies/mL. A negative result does not preclude SARS-Cov-2 infection and should not be used as the sole basis for treatment or other patient management decisions. A negative result may  occur with  improper specimen collection/handling, submission of specimen other than nasopharyngeal swab, presence of viral mutation(s) within the areas targeted by this assay, and inadequate number of viral copies (<131 copies/mL). A negative result must be combined with clinical observations, patient history, and epidemiological information. The expected result is Negative. Fact Sheet for Patients:  PinkCheek.be Fact Sheet for Healthcare Providers:  GravelBags.it This test is not yet ap proved or cleared by the [place] FDA and  has been authorized for detection and/or diagnosis of SARS-CoV-2 by FDA under an Emergency Use Authorization (EUA). This EUA will remain  in effect (meaning this test can be used) for the duration of the COVID-19 declaration under Section 564(b)(1) of the Act, 21 U.S.C. section 360bbb-3(b)(1), unless the authorization is terminated or revoked sooner.    Influenza A by PCR NEGATIVE NEGATIVE Final   Influenza B by PCR NEGATIVE NEGATIVE Final    Comment: (NOTE) The Xpert Xpress SARS-CoV-2/FLU/RSV assay is intended as an aid in  the diagnosis of influenza from Nasopharyngeal swab specimens and  should not be used as a sole basis for treatment. Nasal washings and  aspirates are unacceptable for Xpert Xpress SARS-CoV-2/FLU/RSV  testing. Fact Sheet for Patients: PinkCheek.be Fact Sheet for Healthcare Providers: GravelBags.it This test is not yet approved or cleared by the [place] FDA and  has been authorized for detection and/or diagnosis of SARS-CoV-2 by  FDA under an Emergency Use Authorization (EUA). This EUA will remain  in effect (meaning this test can be used) for the duration of the  Covid-19 declaration under Section 564(b)(1) of the Act, 21  U.S.C. section 360bbb-3(b)(1), unless the authorization is  terminated or  revoked. Performed at [place] [facility], [place], [place] [address]   C Difficile Quick Screen w PCR reflex     Status: None   Collection Time: 12/02/19 11:09 PM   Specimen: Urine, Clean Catch; Stool  Result Value Ref Range Status   C Diff antigen NEGATIVE NEGATIVE Final   C Diff toxin NEGATIVE NEGATIVE Final   C Diff interpretation No C. difficile detected.  Final    Comment: Performed at [facility], [address] [place], [place] [address]         Radiology Studies: DG Chest Portable 1 View  Result Date: 12/02/2019 CLINICAL DATA:  Shortness of breath EXAM: PORTABLE CHEST 1 VIEW COMPARISON:  2017 FINDINGS: The heart size and mediastinal contours are within normal limits. Both lungs are clear.  No pleural effusion or pneumothorax. The visualized skeletal structures are unremarkable. IMPRESSION: No active disease. Electronically Signed   By: Guadlupe Spanish M.D.   On: 12/02/2019 17:05        Scheduled Meds: . heparin  5,000 Units Subcutaneous Q12H  . pantoprazole  20 mg Oral Daily  . pneumococcal 23 valent vaccine  0.5 mL Intramuscular Tomorrow-1000  . venlafaxine XR  150 mg Oral Q breakfast   Continuous Infusions: . sodium chloride 125 mL/hr at 12/03/19 0604     LOS: 1 day    Time spent: 35 minutes    Pennie Banter, DO [facility]   If 7PM-7AM, please contact night-coverage [email or web address] 12/03/2019, 7:21 AM

## 2019-12-03 NOTE — Progress Notes (Signed)
Ch visited wit Pt in response to Order Requisition for an AD. Upon arrival, Pt saw Ch with AD packet and said, that she had been wanting to fill it out and have it notarized. Pt asked questions about having it notarized at the hospital. Pt said that she will look over it, try to complete it with her husband. Pt reported that she has a terrible stomach bug that has made her very dehydrated. Pt said that her kidneys are better. Pt requested for prayer. Ch prayed with Pt, let Pt know about chaplain availability, and then left.

## 2019-12-03 NOTE — Consult Note (Signed)
Consult note       [facility] CONSULT NOTE    Date: 12/03/2019                  Patient Name:  Helen Martinez  MRN: [id number]  DOB: [birth year]  Age / Sex: 49 y.o., female         PCP: Toy Cookey, FNP                 Service Requesting Consult:  Hospitalist                 Reason for Consult:  Acute kidney injury            History of Present Illness: Patient is a 49 y.o. female with a PMHx of depression, GERD, hypertension, porphyria cutanea tarda, who was admitted to [facility] on 12/02/2019 for evaluation of nausea, vomiting, and diarrhea.  Patient reports that she began having nausea, vomiting, diarrhea last Thursday.  She had associated fever with this illness.  She denies any sick contacts.  She also denied any bloody stools.  She was having numerous bowel movements per day and unable to keep any food down.  Upon presentation her creatinine was quite high at 7.80 with low sodium of 127 and low potassium of 3.2.  Metabolic acidosis also present with serum bicarbonate of 16.  Renal function improved today with IV fluid hydration.  BUN down to 60 with a creatinine of 3.92.  Renal ultrasound did not show any renal abnormality though increased liver echogenicity was noted.   Medications: Outpatient medications: Medications Prior to Admission  Medication Sig Dispense Refill Last Dose  . albuterol (PROVENTIL HFA;VENTOLIN HFA) 108 (90 Base) MCG/ACT inhaler Inhale 2 puffs into the lungs every 4 (four) hours as needed. 1 Inhaler 0 prn at prn  . amLODipine (NORVASC) 10 MG tablet Take 10 mg by mouth daily.   12/02/2019 at 1400  . hydrochlorothiazide (HYDRODIURIL) 25 MG tablet Take 1 tablet by mouth daily.  2 12/02/2019 at 1400  . ibuprofen (ADVIL,MOTRIN) 200 MG tablet Take 800 mg by mouth every 6 (six) hours as needed for mild pain or moderate pain.   prn at prn  . losartan (COZAAR) 50 MG tablet Take 1 tablet by mouth daily.  2 12/02/2019 at 1400  . omeprazole (PRILOSEC  OTC) 20 MG tablet Take 20 mg by mouth daily.   12/02/2019 at 1400  . venlafaxine XR (EFFEXOR-XR) 150 MG 24 hr capsule Take 150 mg by mouth daily with breakfast.   12/02/2019 at 1400    Current medications: Current Facility-Administered Medications  Medication Dose Route Frequency Provider Last Rate Last Admin  . 0.9 %  sodium chloride infusion   Intravenous Continuous Mansy, Vernetta Honey, MD 125 mL/hr at 12/03/19 1443 New Bag at 12/03/19 1443  . acetaminophen (TYLENOL) tablet 650 mg  650 mg Oral Q6H PRN Mansy, Jan A, MD       Or  . acetaminophen (TYLENOL) suppository 650 mg  650 mg Rectal Q6H PRN Mansy, Jan A, MD      . albuterol (PROVENTIL) (2.5 MG/3ML) 0.083% nebulizer solution 2.5 mg  2.5 mg Nebulization Q6H PRN Mansy, Jan A, MD      . cefTRIAXone (ROCEPHIN) 2 g in sodium chloride 0.9 % 100 mL IVPB  2 g Intravenous Q24H Esaw Grandchild A, DO 200 mL/hr at 12/03/19 0852 2 g at 12/03/19 0852  . heparin injection 5,000 Units  5,000 Units Subcutaneous Q12H Mansy, Jan A,  MD   5,000 Units at 12/03/19 0856  . magnesium hydroxide (MILK OF MAGNESIA) suspension 30 mL  30 mL Oral Daily PRN Mansy, Jan A, MD      . metroNIDAZOLE (FLAGYL) IVPB 500 mg  500 mg Intravenous Q8H Esaw Grandchild A, DO 100 mL/hr at 12/03/19 0855 500 mg at 12/03/19 0855  . ondansetron (ZOFRAN) tablet 4 mg  4 mg Oral Q6H PRN Mansy, Jan A, MD       Or  . ondansetron [facility]) injection 4 mg  4 mg Intravenous Q6H PRN Mansy, Jan A, MD      . pantoprazole (PROTONIX) EC tablet 20 mg  20 mg Oral Daily Mansy, Jan A, MD   20 mg at 12/03/19 0856  . traZODone (DESYREL) tablet 25 mg  25 mg Oral QHS PRN Mansy, Jan A, MD      . venlafaxine XR (EFFEXOR-XR) 24 hr capsule 150 mg  150 mg Oral Q breakfast Mansy, Jan A, MD   150 mg at 12/03/19 1610      Allergies: Allergies  Allergen Reactions  . Lisinopril Cough      Past Medical History: Past Medical History:  Diagnosis Date  . Depression   . GERD (gastroesophageal reflux disease)   .  Hypertension   . Porphyria cutanea tarda [facility])      Past Surgical History: Past Surgical History:  Procedure Laterality Date  . ABDOMINAL HYSTERECTOMY    . BREAST BIOPSY Right 06/14/2017   Benign  . OOPHORECTOMY       Family History: Family History  Problem Relation Age of Onset  . Hypertension Mother   . Cancer Father        mouth then mets  . Breast cancer Maternal Aunt 63     Social History: Social History   Socioeconomic History  . Marital status: Single    Spouse name: Not on file  . Number of children: 2  . Years of education: Not on file  . Highest education level: Not on file  Occupational History  . Occupation: LPN  Tobacco Use  . Smoking status: Current Every Day Smoker    Packs/day: 1.00    Types: Cigarettes  . Smokeless tobacco: Never Used  Substance and Sexual Activity  . Alcohol use: Yes    Comment: wine 2-3 times a week  . Drug use: No  . Sexual activity: Yes    Birth control/protection: None  Other Topics Concern  . Not on file  Social History Narrative  . Not on file   Social Determinants of Health   Financial Resource Strain:   . Difficulty of Paying Living Expenses:   Food Insecurity:   . Worried About Programme researcher, broadcasting/film/video in the Last Year:   . Barista in the Last Year:   Transportation Needs:   . Freight forwarder (Medical):   Marland Kitchen Lack of Transportation (Non-Medical):   Physical Activity:   . Days of Exercise per Week:   . Minutes of Exercise per Session:   Stress:   . Feeling of Stress :   Social Connections:   . Frequency of Communication with Friends and Family:   . Frequency of Social Gatherings with Friends and Family:   . Attends Religious Services:   . Active Member of Clubs or Organizations:   . Attends Banker Meetings:   Marland Kitchen Marital Status:   Intimate Partner Violence:   . Fear of Current or Ex-Partner:   . Emotionally Abused:   .  Physically Abused:   . Sexually Abused:      Review of  Systems: Review of Systems  Constitutional: Positive for chills, fever and malaise/fatigue.  HENT: Negative for congestion, hearing loss and tinnitus.   Eyes: Negative for blurred vision and double vision.  Respiratory: Negative for cough, sputum production and shortness of breath.   Cardiovascular: Negative for chest pain, palpitations and orthopnea.  Gastrointestinal: Positive for diarrhea, nausea and vomiting.  Genitourinary: Negative for dysuria, frequency and urgency.  Musculoskeletal: Negative for myalgias.  Skin: Negative for itching and rash.  Neurological: Negative for dizziness and focal weakness.  Endo/Heme/Allergies: Negative for polydipsia. Does not bruise/bleed easily.  Psychiatric/Behavioral: The patient is not nervous/anxious.      Vital Signs: Blood pressure (!) 96/56, pulse 86, temperature 97.8 F (36.6 C), temperature source Oral, resp. rate 16, height 5\' 2"  (1.575 m), weight 81.6 kg, SpO2 98 %.  Weight trends: Filed Weights   12/02/19 1626  Weight: 81.6 kg    Physical Exam: General: NAD,   Head: Normocephalic, atraumatic.  Eyes: Anicteric, EOMI  Nose: Mucous membranes moist, not inflammed, nonerythematous.  Throat: Oropharynx nonerythematous, no exudate appreciated.   Neck: Supple, trachea midline.  Lungs:  Normal respiratory effort  Heart: RRR. S1 and S2 normal without gallop, murmur, or rubs.  Abdomen:  Soft, Nondistended, non-tender.  No masses or organomegaly.  Extremities: No pretibial edema.  Neurologic: A&O X3, Motor strength is 5/5 in the all 4 extremities  Skin: No visible rashes, scars.    Lab results: Basic Metabolic Panel: Recent Labs  Lab 12/02/19 1648 12/02/19 1852 12/03/19 0443  NA 127*  --  131*  K 3.2*  --  3.3*  CL 88*  --  100  CO2 16*  --  17*  GLUCOSE 174*  --  127*  BUN 74*  --  68*  CREATININE 7.80*  --  3.92*  CALCIUM 8.4*  --  8.1*  MG  --  2.1  --     Liver Function Tests: Recent Labs  Lab 12/02/19 1648  AST  25  ALT 29  ALKPHOS 45  BILITOT 0.7  PROT 8.8*  ALBUMIN 4.0   Recent Labs  Lab 12/02/19 1648  LIPASE 113*   No results for input(s): AMMONIA in the last 168 hours.  CBC: Recent Labs  Lab 12/02/19 1648 12/03/19 0443  WBC 11.6* 7.1  NEUTROABS 8.8*  --   HGB 16.9* 14.1  HCT 47.5* 39.8  MCV 89.1 90.9  PLT 278 206    Cardiac Enzymes: No results for input(s): CKTOTAL, CKMB, CKMBINDEX, TROPONINI in the last 168 hours.  BNP: Invalid input(s): POCBNP  CBG: No results for input(s): GLUCAP in the last 168 hours.  Microbiology: Results for orders placed or performed during the hospital encounter of 12/02/19  Culture, blood (routine x 2)     Status: None (Preliminary result)   Collection Time: 12/02/19  4:47 PM   Specimen: BLOOD  Result Value Ref Range Status   Specimen Description BLOOD LEFT ANTECUBITAL  Final   Special Requests   Final    BOTTLES DRAWN AEROBIC AND ANAEROBIC Blood Culture adequate volume   Culture   Final    NO GROWTH < 24 HOURS Performed at [facility], [address] [place], [place] [address]    Report Status PENDING  Incomplete  Culture, blood (routine x 2)     Status: None (Preliminary result)   Collection Time: 12/02/19  4:49 PM   Specimen: BLOOD  Result Value Ref Range Status   Specimen Description BLOOD BLOOD RIGHT ARM  Final   Special Requests   Final    BOTTLES DRAWN AEROBIC AND ANAEROBIC Blood Culture adequate volume   Culture   Final    NO GROWTH < 24 HOURS Performed at [facility], [address] [place], [place] [address]    Report Status PENDING  Incomplete  Respiratory Panel by RT PCR (Flu A&B, Covid) - Nasopharyngeal Swab     Status: None   Collection Time: 12/02/19  6:41 PM   Specimen: Nasopharyngeal Swab  Result Value Ref Range Status   SARS Coronavirus 2 by RT PCR NEGATIVE NEGATIVE Final    Comment: (NOTE) SARS-CoV-2 target nucleic acids are NOT DETECTED. The SARS-CoV-2 RNA is generally detectable in  upper respiratoy specimens during the acute phase of infection. The lowest concentration of SARS-CoV-2 viral copies this assay can detect is 131 copies/mL. A negative result does not preclude SARS-Cov-2 infection and should not be used as the sole basis for treatment or other patient management decisions. A negative result may occur with  improper specimen collection/handling, submission of specimen other than nasopharyngeal swab, presence of viral mutation(s) within the areas targeted by this assay, and inadequate number of viral copies (<131 copies/mL). A negative result must be combined with clinical observations, patient history, and epidemiological information. The expected result is Negative. Fact Sheet for Patients:  https://www.moore.com/ Fact Sheet for Healthcare Providers:  https://www.young.biz/ This test is not yet ap proved or cleared by the [place] FDA and  has been authorized for detection and/or diagnosis of SARS-CoV-2 by FDA under an Emergency Use Authorization (EUA). This EUA will remain  in effect (meaning this test can be used) for the duration of the COVID-19 declaration under Section 564(b)(1) of the Act, 21 U.S.C. section 360bbb-3(b)(1), unless the authorization is terminated or revoked sooner.    Influenza A by PCR NEGATIVE NEGATIVE Final   Influenza B by PCR NEGATIVE NEGATIVE Final    Comment: (NOTE) The Xpert Xpress SARS-CoV-2/FLU/RSV assay is intended as an aid in  the diagnosis of influenza from Nasopharyngeal swab specimens and  should not be used as a sole basis for treatment. Nasal washings and  aspirates are unacceptable for Xpert Xpress SARS-CoV-2/FLU/RSV  testing. Fact Sheet for Patients: https://www.moore.com/ Fact Sheet for Healthcare Providers: https://www.young.biz/ This test is not yet approved or cleared by the [place] FDA and  has been authorized for  detection and/or diagnosis of SARS-CoV-2 by  FDA under an Emergency Use Authorization (EUA). This EUA will remain  in effect (meaning this test can be used) for the duration of the  Covid-19 declaration under Section 564(b)(1) of the Act, 21  U.S.C. section 360bbb-3(b)(1), unless the authorization is  terminated or revoked. Performed at [facility], [address] [place], [place] [address]   C Difficile Quick Screen w PCR reflex     Status: None   Collection Time: 12/02/19 11:09 PM   Specimen: Urine, Clean Catch; Stool  Result Value Ref Range Status   C Diff antigen NEGATIVE NEGATIVE Final   C Diff toxin NEGATIVE NEGATIVE Final   C Diff interpretation No C. difficile detected.  Final    Comment: Performed at [facility], [address] [place], [place] [address]    Coagulation Studies: No results for input(s): LABPROT, INR in the last 72 hours.  Urinalysis: Recent Labs    12/02/19 2309  COLORURINE YELLOW*  LABSPEC 1.010  PHURINE 5.0  GLUCOSEU NEGATIVE  HGBUR NEGATIVE  BILIRUBINUR NEGATIVE  KETONESUR NEGATIVE  PROTEINUR NEGATIVE  NITRITE NEGATIVE  LEUKOCYTESUR NEGATIVE      Imaging: US RENAL  Result Date: 12/03/2019 CLINICAL DATA:  Acute kidney insufficiency. EXAM: RENAL / URINARY TRACT ULTRASOUND COMPLETE COMPARISON:  None. FINDINGS: Right Kidney: Renal measurements: 12.2 x 6.1 x 6.2 cm = volume: 240 mL . Echogenicity within normal limits. No mass or hydronephrosis visualized. Left Kidney: Renal measurements: 11.8 x 6.8 x 6.6 cm = volume: 277 mL. Echogenicity within normal limits. No mass or hydronephrosis visualized. Bladder: Appears normal for degree of bladder distention. Other: The liver is diffusely echogenic and somewhat heterogeneous. No discrete lesion is present. IMPRESSION: 1. Normal sonographic appearance of the kidneys bilaterally. No obstruction or mass lesion. 2. Liver is diffusely echogenic, suggesting hepatic steatosis. Electronically  Signed   By: San Morelle M.D.   On: 12/03/2019 09:10   DG Chest Portable 1 View  Result Date: 12/02/2019 CLINICAL DATA:  Shortness of breath EXAM: PORTABLE CHEST 1 VIEW COMPARISON:  2017 FINDINGS: The heart size and mediastinal contours are within normal limits. Both lungs are clear. No pleural effusion or pneumothorax. The visualized skeletal structures are unremarkable. IMPRESSION: No active disease. Electronically Signed   By: Macy Mis M.D.   On: 12/02/2019 17:05      Assessment & Plan: Pt is a 49 y.o. female with a PMHx of depression, GERD, hypertension, porphyria cutanea tarda, who was admitted to [facility] on 12/02/2019 for evaluation of nausea, vomiting, and diarrhea found to have severe acute kidney injury.  1.  Acute kidney injury likely secondary to prolonged dehydration from nausea, vomiting, diarrhea. 2.  Hyponatremia secondary to volume loss. 3.  Metabolic acidosis due to diarrhea. 4.  Hypokalemia.  Plan: Patient admitted with severe nausea, vomiting, and diarrhea.  No known sick contacts.  Creatinine was as high as 7.8 upon admission but now down to 3.92 with IV fluid hydration.  Serum sodium also improved up to 131.  Potassium still slightly low at 3.3.  Continue IV fluid hydration for now.  Renal ultrasound reviewed and was negative for hydronephrosis though there is a suggestion of underlying hepatic steatosis.  Avoid nephrotoxins over the course of hospitalization.  Thanks for consultation.

## 2019-12-04 DIAGNOSIS — F329 Major depressive disorder, single episode, unspecified: Secondary | ICD-10-CM

## 2019-12-04 DIAGNOSIS — A020 Salmonella enteritis: Secondary | ICD-10-CM

## 2019-12-04 DIAGNOSIS — E876 Hypokalemia: Secondary | ICD-10-CM

## 2019-12-04 DIAGNOSIS — E861 Hypovolemia: Secondary | ICD-10-CM

## 2019-12-04 DIAGNOSIS — I9589 Other hypotension: Secondary | ICD-10-CM

## 2019-12-04 LAB — GI PATHOGEN PANEL BY PCR, STOOL
Adenovirus F 40/41: NOT DETECTED
Astrovirus: NOT DETECTED
Campylobacter by PCR: NOT DETECTED
Cryptosporidium by PCR: NOT DETECTED
Cyclospora cayetanensis: NOT DETECTED
E coli (ETEC) LT/ST: NOT DETECTED
E coli (STEC): NOT DETECTED
Entamoeba histolytica: NOT DETECTED
Enteroaggregative E coli: NOT DETECTED
Enteropathogenic E coli: NOT DETECTED
G lamblia by PCR: NOT DETECTED
Norovirus GI/GII: NOT DETECTED
Plesiomonas shigelloides: NOT DETECTED
Rotavirus A by PCR: NOT DETECTED
Salmonella by PCR: DETECTED — AB
Sapovirus: NOT DETECTED
Shigella by PCR: NOT DETECTED
Vibrio cholerae: NOT DETECTED
Vibrio: NOT DETECTED
Yersinia enterocolitica: NOT DETECTED

## 2019-12-04 LAB — CBC WITH DIFFERENTIAL/PLATELET
Abs Immature Granulocytes: 0.05 10*3/uL (ref 0.00–0.07)
Basophils Absolute: 0 10*3/uL (ref 0.0–0.1)
Basophils Relative: 0 %
Eosinophils Absolute: 0.1 10*3/uL (ref 0.0–0.5)
Eosinophils Relative: 1 %
HCT: 37.5 % (ref 36.0–46.0)
Hemoglobin: 13.1 g/dL (ref 12.0–15.0)
Immature Granulocytes: 1 %
Lymphocytes Relative: 20 %
Lymphs Abs: 1.1 10*3/uL (ref 0.7–4.0)
MCH: 31.6 pg (ref 26.0–34.0)
MCHC: 34.9 g/dL (ref 30.0–36.0)
MCV: 90.6 fL (ref 80.0–100.0)
Monocytes Absolute: 0.6 10*3/uL (ref 0.1–1.0)
Monocytes Relative: 11 %
Neutro Abs: 3.6 10*3/uL (ref 1.7–7.7)
Neutrophils Relative %: 67 %
Platelets: 202 10*3/uL (ref 150–400)
RBC: 4.14 MIL/uL (ref 3.87–5.11)
RDW: 12.2 % (ref 11.5–15.5)
WBC: 5.4 10*3/uL (ref 4.0–10.5)
nRBC: 0 % (ref 0.0–0.2)

## 2019-12-04 LAB — BASIC METABOLIC PANEL
Anion gap: 8 (ref 5–15)
BUN: 26 mg/dL — ABNORMAL HIGH (ref 6–20)
CO2: 22 mmol/L (ref 22–32)
Calcium: 8.3 mg/dL — ABNORMAL LOW (ref 8.9–10.3)
Chloride: 107 mmol/L (ref 98–111)
Creatinine, Ser: 0.63 mg/dL (ref 0.44–1.00)
GFR calc Af Amer: >60 mL/min (ref >60)
GFR calc non Af Amer: >60 mL/min (ref >60)
Glucose, Bld: 121 mg/dL — ABNORMAL HIGH (ref 70–99)
Potassium: 3.3 mmol/L — ABNORMAL LOW (ref 3.5–5.1)
Sodium: 137 mmol/L (ref 135–145)

## 2019-12-04 LAB — MAGNESIUM: Magnesium: 2.2 mg/dL (ref 1.7–2.4)

## 2019-12-04 LAB — PROCALCITONIN: Procalcitonin: 0.15 ng/mL

## 2019-12-04 MED ORDER — POTASSIUM CHLORIDE CRYS ER 20 MEQ PO TBCR
40.0000 meq | EXTENDED_RELEASE_TABLET | Freq: Once | ORAL | Status: AC
  Administered 2019-12-04: 40 meq via ORAL
  Filled 2019-12-04: qty 2

## 2019-12-04 NOTE — Progress Notes (Signed)
[facility]  ROUNDING NOTE   Subjective:  Patient seen and evaluated bedside. Still having some diarrhea. However renal function significantly improved. Creatinine down 0.63.   Objective:  Vital signs in last 24 hours:  Temp:  [98.7 F (37.1 C)-99 F (37.2 C)] 99 F (37.2 C) (03/31 0832) Pulse Rate:  [62-148] 66 (03/31 0832) Resp:  [11-31] 16 (03/31 0832) BP: (88-115)/(48-68) 115/68 (03/31 0832) SpO2:  [89 %-100 %] 99 % (03/31 0832)  Weight change:  Filed Weights   12/02/19 1626  Weight: 81.6 kg    Intake/Output: I/O last 3 completed shifts: In: 2929 [P.O.:720; I.V.:2015.9; IV Piggyback:193.1] Out: -    Intake/Output this shift:  No intake/output data recorded.  Physical Exam: General: No acute distress  Head: Normocephalic, atraumatic. Moist oral mucosal membranes  Eyes: Anicteric  Neck: Supple, trachea midline  Lungs:  Normal effort  Heart: regular  Abdomen:  Soft, nontender, bowel sounds present  Extremities: no peripheral edema.  Neurologic: Awake, alert, following commands  Skin: No lesions       Basic Metabolic Panel: Recent Labs  Lab 12/02/19 1648 12/02/19 1852 12/03/19 0443 12/04/19 0630  NA 127*  --  131* 137  K 3.2*  --  3.3* 3.3*  CL 88*  --  100 107  CO2 16*  --  17* 22  GLUCOSE 174*  --  127* 121*  BUN 74*  --  68* 26*  CREATININE 7.80*  --  3.92* 0.63  CALCIUM 8.4*  --  8.1* 8.3*  MG  --  2.1  --  2.2    Liver Function Tests: Recent Labs  Lab 12/02/19 1648  AST 25  ALT 29  ALKPHOS 45  BILITOT 0.7  PROT 8.8*  ALBUMIN 4.0   Recent Labs  Lab 12/02/19 1648  LIPASE 113*   No results for input(s): AMMONIA in the last 168 hours.  CBC: Recent Labs  Lab 12/02/19 1648 12/03/19 0443 12/04/19 0630  WBC 11.6* 7.1 5.4  NEUTROABS 8.8*  --  3.6  HGB 16.9* 14.1 13.1  HCT 47.5* 39.8 37.5  MCV 89.1 90.9 90.6  PLT 278 206 202    Cardiac Enzymes: No results for input(s): CKTOTAL, CKMB, CKMBINDEX, TROPONINI in  the last 168 hours.  BNP: Invalid input(s): POCBNP  CBG: No results for input(s): GLUCAP in the last 168 hours.  Microbiology: Results for orders placed or performed during the hospital encounter of 12/02/19  Culture, blood (routine x 2)     Status: None (Preliminary result)   Collection Time: 12/02/19  4:47 PM   Specimen: BLOOD  Result Value Ref Range Status   Specimen Description BLOOD LEFT ANTECUBITAL  Final   Special Requests   Final    BOTTLES DRAWN AEROBIC AND ANAEROBIC Blood Culture adequate volume   Culture   Final    NO GROWTH 2 DAYS Performed at [facility], [address] [place], [place] [address]    Report Status PENDING  Incomplete  Culture, blood (routine x 2)     Status: None (Preliminary result)   Collection Time: 12/02/19  4:49 PM   Specimen: BLOOD  Result Value Ref Range Status   Specimen Description BLOOD BLOOD RIGHT ARM  Final   Special Requests   Final    BOTTLES DRAWN AEROBIC AND ANAEROBIC Blood Culture adequate volume   Culture   Final    NO GROWTH 2 DAYS Performed at [facility], [address], [place], [place] [address]  Report Status PENDING  Incomplete  Respiratory Panel by RT PCR (Flu A&B, Covid) - Nasopharyngeal Swab     Status: None   Collection Time: 12/02/19  6:41 PM   Specimen: Nasopharyngeal Swab  Result Value Ref Range Status   SARS Coronavirus 2 by RT PCR NEGATIVE NEGATIVE Final    Comment: (NOTE) SARS-CoV-2 target nucleic acids are NOT DETECTED. The SARS-CoV-2 RNA is generally detectable in upper respiratoy specimens during the acute phase of infection. The lowest concentration of SARS-CoV-2 viral copies this assay can detect is 131 copies/mL. A negative result does not preclude SARS-Cov-2 infection and should not be used as the sole basis for treatment or other patient management decisions. A negative result may occur with  improper specimen collection/handling, submission of specimen other than  nasopharyngeal swab, presence of viral mutation(s) within the areas targeted by this assay, and inadequate number of viral copies (<131 copies/mL). A negative result must be combined with clinical observations, patient history, and epidemiological information. The expected result is Negative. Fact Sheet for Patients:  https://www.moore.com/ Fact Sheet for Healthcare Providers:  https://www.young.biz/ This test is not yet ap proved or cleared by the [place] FDA and  has been authorized for detection and/or diagnosis of SARS-CoV-2 by FDA under an Emergency Use Authorization (EUA). This EUA will remain  in effect (meaning this test can be used) for the duration of the COVID-19 declaration under Section 564(b)(1) of the Act, 21 U.S.C. section 360bbb-3(b)(1), unless the authorization is terminated or revoked sooner.    Influenza A by PCR NEGATIVE NEGATIVE Final   Influenza B by PCR NEGATIVE NEGATIVE Final    Comment: (NOTE) The Xpert Xpress SARS-CoV-2/FLU/RSV assay is intended as an aid in  the diagnosis of influenza from Nasopharyngeal swab specimens and  should not be used as a sole basis for treatment. Nasal washings and  aspirates are unacceptable for Xpert Xpress SARS-CoV-2/FLU/RSV  testing. Fact Sheet for Patients: https://www.moore.com/ Fact Sheet for Healthcare Providers: https://www.young.biz/ This test is not yet approved or cleared by the [place] FDA and  has been authorized for detection and/or diagnosis of SARS-CoV-2 by  FDA under an Emergency Use Authorization (EUA). This EUA will remain  in effect (meaning this test can be used) for the duration of the  Covid-19 declaration under Section 564(b)(1) of the Act, 21  U.S.C. section 360bbb-3(b)(1), unless the authorization is  terminated or revoked. Performed at [facility], [address], [place], [place] [address]   Urine  culture     Status: None   Collection Time: 12/02/19 11:09 PM   Specimen: In/Out Cath Urine  Result Value Ref Range Status   Specimen Description   Final    IN/OUT CATH URINE Performed at [facility], [address], [place], [place] [address]    Special Requests   Final    NONE Performed at [facility], [address], [place], [place] [address]    Culture   Final    NO GROWTH Performed at [facility], [address], [place], [place] [address]    Report Status 12/03/2019 FINAL  Final  GI pathogen panel by PCR, stool     Status: Abnormal   Collection Time: 12/02/19 11:09 PM   Specimen: Urine, Clean Catch; Stool  Result Value Ref Range Status   Plesiomonas shigelloides NOT DETECTED NOT DETECTED Final   Yersinia enterocolitica NOT DETECTED NOT DETECTED Final   Vibrio NOT DETECTED NOT DETECTED Final   Enteropathogenic E  coli NOT DETECTED NOT DETECTED Final   E coli (ETEC) LT/ST NOT DETECTED NOT DETECTED Final   E coli 5956 by PCR Not applicable NOT DETECTED Final   Cryptosporidium by PCR NOT DETECTED NOT DETECTED Final   Entamoeba histolytica NOT DETECTED NOT DETECTED Final   Adenovirus F 40/41 NOT DETECTED NOT DETECTED Final   Norovirus GI/GII NOT DETECTED NOT DETECTED Final   Sapovirus NOT DETECTED NOT DETECTED Final    Comment: (NOTE) Performed At: [facility] [place], [place] [id number] Rush Farmer MD RJ:[id number]    Vibrio cholerae NOT DETECTED NOT DETECTED Final   Campylobacter by PCR NOT DETECTED NOT DETECTED Final   Salmonella by PCR DETECTED (A) NOT DETECTED Final   E coli (STEC) NOT DETECTED NOT DETECTED Final   Enteroaggregative E coli NOT DETECTED NOT DETECTED Final   Shigella by PCR NOT DETECTED NOT DETECTED Final   Cyclospora cayetanensis NOT DETECTED NOT DETECTED Final   Astrovirus NOT DETECTED NOT DETECTED Final   G lamblia by PCR NOT DETECTED NOT DETECTED Final   Rotavirus A by PCR NOT DETECTED NOT  DETECTED Final  C Difficile Quick Screen w PCR reflex     Status: None   Collection Time: 12/02/19 11:09 PM   Specimen: Urine, Clean Catch; Stool  Result Value Ref Range Status   C Diff antigen NEGATIVE NEGATIVE Final   C Diff toxin NEGATIVE NEGATIVE Final   C Diff interpretation No C. difficile detected.  Final    Comment: Performed at [facility], [address] [place], [place] [address]    Coagulation Studies: No results for input(s): LABPROT, INR in the last 72 hours.  Urinalysis: Recent Labs    12/02/19 2309  COLORURINE YELLOW*  LABSPEC 1.010  PHURINE 5.0  GLUCOSEU NEGATIVE  HGBUR NEGATIVE  BILIRUBINUR NEGATIVE  KETONESUR NEGATIVE  PROTEINUR NEGATIVE  NITRITE NEGATIVE  LEUKOCYTESUR NEGATIVE      Imaging: US RENAL  Result Date: 12/03/2019 CLINICAL DATA:  Acute kidney insufficiency. EXAM: RENAL / URINARY TRACT ULTRASOUND COMPLETE COMPARISON:  None. FINDINGS: Right Kidney: Renal measurements: 12.2 x 6.1 x 6.2 cm = volume: 240 mL . Echogenicity within normal limits. No mass or hydronephrosis visualized. Left Kidney: Renal measurements: 11.8 x 6.8 x 6.6 cm = volume: 277 mL. Echogenicity within normal limits. No mass or hydronephrosis visualized. Bladder: Appears normal for degree of bladder distention. Other: The liver is diffusely echogenic and somewhat heterogeneous. No discrete lesion is present. IMPRESSION: 1. Normal sonographic appearance of the kidneys bilaterally. No obstruction or mass lesion. 2. Liver is diffusely echogenic, suggesting hepatic steatosis. Electronically Signed   By: San Morelle M.D.   On: 12/03/2019 09:10   DG Chest Portable 1 View  Result Date: 12/02/2019 CLINICAL DATA:  Shortness of breath EXAM: PORTABLE CHEST 1 VIEW COMPARISON:  2017 FINDINGS: The heart size and mediastinal contours are within normal limits. Both lungs are clear. No pleural effusion or pneumothorax. The visualized skeletal structures are unremarkable. IMPRESSION:  No active disease. Electronically Signed   By: Macy Mis M.D.   On: 12/02/2019 17:05     Medications:   . sodium chloride 125 mL/hr at 12/04/19 0011  . cefTRIAXone (ROCEPHIN)  IV 2 g (12/04/19 1212)  . metronidazole 100 mL/hr at 12/04/19 1209   . heparin  5,000 Units Subcutaneous Q12H  . pantoprazole  20 mg Oral Daily  . venlafaxine XR  150 mg Oral Q breakfast   acetaminophen **OR** acetaminophen, albuterol, magnesium hydroxide, ondansetron **  OR** ondansetron (ZOFRAN) IV, traZODone  Assessment/ Plan:  49 y.o. female with a PMHx of depression, GERD, hypertension, porphyria cutanea tarda, who was admitted to [facility] on 12/02/2019 for evaluation of nausea, vomiting, and diarrhea found to have severe acute kidney injury.  1.  Acute kidney injury likely secondary to prolonged dehydration from nausea, vomiting, diarrhea due to Salmonella infection 2.  Hyponatremia secondary to volume loss. 3.  Metabolic acidosis due to diarrhea. 4.  Hypokalemia.  Plan: Stool studies positive for Salmonella infection.  Renal function however has improved significantly.  Creatinine down to 0.63 with a BUN of 26.  Hyponatremia also improved as serum sodium now 137.  Serum bicarbonate also significantly improved with most recent serum bicarbonate of 22.  Still having some diarrhea.  Okay to continue IV fluids for now otherwise.  Avoid nephrotoxins as possible.  No further renal input at this time therefore we will sign off.  Thanks for consultation.   LOS: 2 Helen Martinez 3/31/20211:51 PM

## 2019-12-04 NOTE — Progress Notes (Signed)
PROGRESS NOTE    Helen Martinez  EXB:[id number] DOB: Jul 08, 1971 DOA: 12/02/2019 PCP: Toy Cookey, FNP    Brief Narrative:  Helen Martinez  is a 49 y.o. Caucasian female with a known history of depression, hypertension and GERD, who presented to the emergency room with acute onset of intractable nausea and vomiting since Wednesday with associated diarrhea    Consultants:   Nephrology  Procedures:   Antimicrobials:   Ceftriaxone   Subjective: Still with diarrhea, every hour, after eating or drinking. Still quite watery. No n/v.  Objective: Vitals:   12/04/19 0007 12/04/19 0008 12/04/19 0050 12/04/19 0832  BP:   113/67 115/68  Pulse: 72 75 76 66  Resp: 19 17 17 16   Temp:    99 F (37.2 C)  TempSrc:    Oral  SpO2: 98% 97% 98% 99%  Weight:      Height:        Intake/Output Summary (Last 24 hours) at 12/04/2019 1353 Last data filed at 12/03/2019 1730 Gross per 24 hour  Intake 1782.29 ml  Output --  Net 1782.29 ml   Filed Weights   12/02/19 1626  Weight: 81.6 kg    Examination:  General exam: Appears calm and comfortable  Respiratory system: Clear to auscultation. Respiratory effort normal. Cardiovascular system: S1 & S2 heard, RRR. No JVD, murmurs, rubs, gallops or clicks.  Gastrointestinal system: Abdomen is nondistended, soft and nontender.  Normal bowel sounds heard. Central nervous system: Alert and oriented. No focal neurological deficits. Extremities: no edema Skin: warm dry Psychiatry: Judgement and insight appear normal. Mood & affect appropriate.     Data Reviewed: I have personally reviewed following labs and imaging studies  CBC: Recent Labs  Lab 12/02/19 1648 12/03/19 0443 12/04/19 0630  WBC 11.6* 7.1 5.4  NEUTROABS 8.8*  --  3.6  HGB 16.9* 14.1 13.1  HCT 47.5* 39.8 37.5  MCV 89.1 90.9 90.6  PLT 278 206 202   Basic Metabolic Panel: Recent Labs  Lab 12/02/19 1648 12/02/19 1852 12/03/19 0443 12/04/19 0630  NA 127*  --  131*  137  K 3.2*  --  3.3* 3.3*  CL 88*  --  100 107  CO2 16*  --  17* 22  GLUCOSE 174*  --  127* 121*  BUN 74*  --  68* 26*  CREATININE 7.80*  --  3.92* 0.63  CALCIUM 8.4*  --  8.1* 8.3*  MG  --  2.1  --  2.2   GFR: Estimated Creatinine Clearance: 83.3 mL/min (by C-G formula based on SCr of 0.63 mg/dL). Liver Function Tests: Recent Labs  Lab 12/02/19 1648  AST 25  ALT 29  ALKPHOS 45  BILITOT 0.7  PROT 8.8*  ALBUMIN 4.0   Recent Labs  Lab 12/02/19 1648  LIPASE 113*   No results for input(s): AMMONIA in the last 168 hours. Coagulation Profile: No results for input(s): INR, PROTIME in the last 168 hours. Cardiac Enzymes: No results for input(s): CKTOTAL, CKMB, CKMBINDEX, TROPONINI in the last 168 hours. BNP (last 3 results) No results for input(s): PROBNP in the last 8760 hours. HbA1C: No results for input(s): HGBA1C in the last 72 hours. CBG: No results for input(s): GLUCAP in the last 168 hours. Lipid Profile: No results for input(s): CHOL, HDL, LDLCALC, TRIG, CHOLHDL, LDLDIRECT in the last 72 hours. Thyroid Function Tests: No results for input(s): TSH, T4TOTAL, FREET4, T3FREE, THYROIDAB in the last 72 hours. Anemia Panel: No results for input(s): VITAMINB12, FOLATE, FERRITIN, TIBC,  IRON, RETICCTPCT in the last 72 hours. Sepsis Labs: Recent Labs  Lab 12/02/19 1648 12/02/19 1649 12/02/19 1852 12/03/19 0443 12/04/19 0630  PROCALCITON 1.46  --   --  0.34 0.15  LATICACIDVEN  --  1.9 1.5  --   --     Recent Results (from the past 240 hour(s))  Culture, blood (routine x 2)     Status: None (Preliminary result)   Collection Time: 12/02/19  4:47 PM   Specimen: BLOOD  Result Value Ref Range Status   Specimen Description BLOOD LEFT ANTECUBITAL  Final   Special Requests   Final    BOTTLES DRAWN AEROBIC AND ANAEROBIC Blood Culture adequate volume   Culture   Final    NO GROWTH 2 DAYS Performed at [facility], [address], [place], [place] [address]     Report Status PENDING  Incomplete  Culture, blood (routine x 2)     Status: None (Preliminary result)   Collection Time: 12/02/19  4:49 PM   Specimen: BLOOD  Result Value Ref Range Status   Specimen Description BLOOD BLOOD RIGHT ARM  Final   Special Requests   Final    BOTTLES DRAWN AEROBIC AND ANAEROBIC Blood Culture adequate volume   Culture   Final    NO GROWTH 2 DAYS Performed at [facility], [address], [place], [place] [address]    Report Status PENDING  Incomplete  Respiratory Panel by RT PCR (Flu A&B, Covid) - Nasopharyngeal Swab     Status: None   Collection Time: 12/02/19  6:41 PM   Specimen: Nasopharyngeal Swab  Result Value Ref Range Status   SARS Coronavirus 2 by RT PCR NEGATIVE NEGATIVE Final    Comment: (NOTE) SARS-CoV-2 target nucleic acids are NOT DETECTED. The SARS-CoV-2 RNA is generally detectable in upper respiratoy specimens during the acute phase of infection. The lowest concentration of SARS-CoV-2 viral copies this assay can detect is 131 copies/mL. A negative result does not preclude SARS-Cov-2 infection and should not be used as the sole basis for treatment or other patient management decisions. A negative result may occur with  improper specimen collection/handling, submission of specimen other than nasopharyngeal swab, presence of viral mutation(s) within the areas targeted by this assay, and inadequate number of viral copies (<131 copies/mL). A negative result must be combined with clinical observations, patient history, and epidemiological information. The expected result is Negative. Fact Sheet for Patients:  PinkCheek.be Fact Sheet for Healthcare Providers:  GravelBags.it This test is not yet ap proved or cleared by the [place] FDA and  has been authorized for detection and/or diagnosis of SARS-CoV-2 by FDA under an Emergency Use Authorization (EUA). This EUA will remain    in effect (meaning this test can be used) for the duration of the COVID-19 declaration under Section 564(b)(1) of the Act, 21 U.S.C. section 360bbb-3(b)(1), unless the authorization is terminated or revoked sooner.    Influenza A by PCR NEGATIVE NEGATIVE Final   Influenza B by PCR NEGATIVE NEGATIVE Final    Comment: (NOTE) The Xpert Xpress SARS-CoV-2/FLU/RSV assay is intended as an aid in  the diagnosis of influenza from Nasopharyngeal swab specimens and  should not be used as a sole basis for treatment. Nasal washings and  aspirates are unacceptable for Xpert Xpress SARS-CoV-2/FLU/RSV  testing. Fact Sheet for Patients: PinkCheek.be Fact Sheet for Healthcare Providers: GravelBags.it This test is not yet approved or cleared by the [place] FDA and  has been authorized for detection  and/or diagnosis of SARS-CoV-2 by  FDA under an Emergency Use Authorization (EUA). This EUA will remain  in effect (meaning this test can be used) for the duration of the  Covid-19 declaration under Section 564(b)(1) of the Act, 21  U.S.C. section 360bbb-3(b)(1), unless the authorization is  terminated or revoked. Performed at [facility], [address] [place] [address] [place], [place] [address]   Urine culture     Status: None   Collection Time: 12/02/19 11:09 PM   Specimen: In/Out Cath Urine  Result Value Ref Range Status   Specimen Description   Final    IN/OUT CATH URINE Performed at [facility], [address] [place], [place] [address]    Special Requests   Final    NONE Performed at [facility], [address], [place], [place] [address]    Culture   Final    NO GROWTH Performed at [facility], [address], [place], [place] [address]    Report Status 12/03/2019 FINAL  Final  GI pathogen panel by PCR, stool     Status: Abnormal   Collection Time: 12/02/19 11:09 PM   Specimen: Urine, Clean Catch;  Stool  Result Value Ref Range Status   Plesiomonas shigelloides NOT DETECTED NOT DETECTED Final   Yersinia enterocolitica NOT DETECTED NOT DETECTED Final   Vibrio NOT DETECTED NOT DETECTED Final   Enteropathogenic E coli NOT DETECTED NOT DETECTED Final   E coli (ETEC) LT/ST NOT DETECTED NOT DETECTED Final   E coli 0157 by PCR Not applicable NOT DETECTED Final   Cryptosporidium by PCR NOT DETECTED NOT DETECTED Final   Entamoeba histolytica NOT DETECTED NOT DETECTED Final   Adenovirus F 40/41 NOT DETECTED NOT DETECTED Final   Norovirus GI/GII NOT DETECTED NOT DETECTED Final   Sapovirus NOT DETECTED NOT DETECTED Final    Comment: (NOTE) Performed At: [facility] [address] [place], [place] [id number] Jolene Schimke MD YB:[id number]    Vibrio cholerae NOT DETECTED NOT DETECTED Final   Campylobacter by PCR NOT DETECTED NOT DETECTED Final   Salmonella by PCR DETECTED (A) NOT DETECTED Final   E coli (STEC) NOT DETECTED NOT DETECTED Final   Enteroaggregative E coli NOT DETECTED NOT DETECTED Final   Shigella by PCR NOT DETECTED NOT DETECTED Final   Cyclospora cayetanensis NOT DETECTED NOT DETECTED Final   Astrovirus NOT DETECTED NOT DETECTED Final   G lamblia by PCR NOT DETECTED NOT DETECTED Final   Rotavirus A by PCR NOT DETECTED NOT DETECTED Final  C Difficile Quick Screen w PCR reflex     Status: None   Collection Time: 12/02/19 11:09 PM   Specimen: Urine, Clean Catch; Stool  Result Value Ref Range Status   C Diff antigen NEGATIVE NEGATIVE Final   C Diff toxin NEGATIVE NEGATIVE Final   C Diff interpretation No C. difficile detected.  Final    Comment: Performed at [facility], [address] [place], [place] [address]         Radiology Studies: US RENAL  Result Date: 12/03/2019 CLINICAL DATA:  Acute kidney insufficiency. EXAM: RENAL / URINARY TRACT ULTRASOUND COMPLETE COMPARISON:  None. FINDINGS: Right Kidney: Renal measurements: 12.2 x 6.1 x 6.2 cm =  volume: 240 mL . Echogenicity within normal limits. No mass or hydronephrosis visualized. Left Kidney: Renal measurements: 11.8 x 6.8 x 6.6 cm = volume: 277 mL. Echogenicity within normal limits. No mass or hydronephrosis visualized. Bladder: Appears normal for degree of bladder  distention. Other: The liver is diffusely echogenic and somewhat heterogeneous. No discrete lesion is present. IMPRESSION: 1. Normal sonographic appearance of the kidneys bilaterally. No obstruction or mass lesion. 2. Liver is diffusely echogenic, suggesting hepatic steatosis. Electronically Signed   By: Marin Roberts M.D.   On: 12/03/2019 09:10   DG Chest Portable 1 View  Result Date: 12/02/2019 CLINICAL DATA:  Shortness of breath EXAM: PORTABLE CHEST 1 VIEW COMPARISON:  2017 FINDINGS: The heart size and mediastinal contours are within normal limits. Both lungs are clear. No pleural effusion or pneumothorax. The visualized skeletal structures are unremarkable. IMPRESSION: No active disease. Electronically Signed   By: Guadlupe Spanish M.D.   On: 12/02/2019 17:05        Scheduled Meds: . heparin  5,000 Units Subcutaneous Q12H  . pantoprazole  20 mg Oral Daily  . venlafaxine XR  150 mg Oral Q breakfast   Continuous Infusions: . sodium chloride 125 mL/hr at 12/04/19 0011  . cefTRIAXone (ROCEPHIN)  IV 2 g (12/04/19 1212)  . metronidazole 100 mL/hr at 12/04/19 1209    Assessment & Plan:   Active Problems:   AKI (acute kidney injury) (HCC)   1.  Acute kidney injury.  This is likely prerenal and multifactorial secondary to intractable nausea and vomiting with subsequent dehydration as well as diuretic therapy with HCTZ as well as ARB therapy and NSAIDs. Renal function has improved with IV fluids We will continue IV hydration since she is still having diarrhea Avoid nephrotoxin meds Nephrology signed off, thank you for input    2.  Acute Salmonella gastroenteritis with subsequent sepsis as manifested by  hypotension and tachycardia as well as leukocytosis.  He has no severe sepsis or shock. -Given associated sepsis, will continue antibiotic therapy with IV Rocephin and Flagyl. Still quite symptomatic . Will continue support care with ivf and ivabx.    3.  Hypotension with history of hypertension. -We will hold all his antihypertensives.  With improvement of blood pressure, Norvasc can be continued with holding parameters.  4.  Hypokalemia, hyponatremia and hypochloremia. -This is clearly secondary to dehydration , volume depletion, and hypokalemia   5.  Depression. -Effexor XR will be continued.    DVT prophylaxis: Heparin Code Status:dnr Family Communication: none at bedside Disposition Plan: Possible DC in 1 to 2 days when medically stable Barrier: Still having hourly diarrhea.  Risk of discharging her causing dehydration from her diarrhea is high with higher readmission rate. Not tolerating po intake due to diarrhea.  We will continue IV fluids and antibiotics for Salmonella gastroenteritis and if her diarrhea slows down possible DC in 1 to 2 days.      LOS: 2 days   Time spent: 45 minutes with more than 50% COC    Lynn Ito, MD [facility] Pager [phone]  If 7PM-7AM, please contact night-coverage [email or web address] Password [facility] 12/04/2019, 1:53 PM

## 2019-12-05 LAB — BASIC METABOLIC PANEL
Anion gap: 9 (ref 5–15)
BUN: 13 mg/dL (ref 6–20)
CO2: 22 mmol/L (ref 22–32)
Calcium: 8.3 mg/dL — ABNORMAL LOW (ref 8.9–10.3)
Chloride: 107 mmol/L (ref 98–111)
Creatinine, Ser: 0.42 mg/dL — ABNORMAL LOW (ref 0.44–1.00)
GFR calc Af Amer: >60 mL/min (ref >60)
GFR calc non Af Amer: >60 mL/min (ref >60)
Glucose, Bld: 112 mg/dL — ABNORMAL HIGH (ref 70–99)
Potassium: 3.4 mmol/L — ABNORMAL LOW (ref 3.5–5.1)
Sodium: 138 mmol/L (ref 135–145)

## 2019-12-05 MED ORDER — POTASSIUM CHLORIDE CRYS ER 20 MEQ PO TBCR
40.0000 meq | EXTENDED_RELEASE_TABLET | Freq: Once | ORAL | Status: DC
  Filled 2019-12-05: qty 2

## 2019-12-05 MED ORDER — CIPROFLOXACIN HCL 500 MG PO TABS: 500.0000 mg | ORAL_TABLET | Freq: Two times a day (BID) | ORAL | 0 refills | Status: AC

## 2019-12-05 NOTE — Discharge Summary (Signed)
Helen Martinez WUJ:[id number]:[id number] DOB: 05-Oct-1970 DOA: 12/02/2019  PCP: Helen CookeyHeadrick, Emily, FNP  Admit date: 12/02/2019 Discharge date: 12/05/2019  Admitted From: Home Disposition: Home  Recommendations for Outpatient Follow-up:  1. Follow up with PCP in 1 week 2. Please obtain BMP/CBC in one week 3. Please follow up on the following pending results:  Home Health: None   Discharge Condition:Stable CODE STATUS: Full Diet recommendation: Heart Healthy  Brief/Interim Summary: Helen Martinez  is a 49 y.o. Caucasian female with a known history of depression, hypertension and GERD, who presented to the emergency room with acute onset of intractable nausea and vomiting with associated diarrhea.  She admitted to fever that was up to 102.  She has been having mild dyspnea mainly on exertion without chest pain.  Her second dose of Pfizer COVID-19 vaccine more than a month ago. Influenza antigens and COVID-19 PCR came back negative. Her stool came back positive for Salmonella. She was started on iv abx.. On admission she was found with acute kidney injury due to vomiting, diuretic use, and dehydration from diarrhea.  She was started on IV fluids with her creatinine improving.  Her blood pressures were on the soft side her blood pressure medications were stopped.  She was on IV fluids.  She was started on IV Rocephin and Flagyl for her acute Salmonella gastroenteritis with subsequent sepsis.  Her diarrhea has improved now.  She is remained afebrile.  Her electrolytes were replaced.  She is stable to be discharged home now.  Discharge Diagnoses:  Active Problems:   AKI (acute kidney injury) [facility](HCC)    Discharge Instructions  Discharge Instructions    Call MD for:  temperature >100.4   Complete by: As directed    Diet - low sodium heart healthy   Complete by: As directed    Discharge instructions   Complete by: As directed    F/u with pcp in 3 days , need blood work   Increase activity slowly   Complete by:  As directed      Allergies as of 12/05/2019      Reactions   Lisinopril Cough      Medication List    STOP taking these medications   amLODipine 10 MG tablet Commonly known as: NORVASC   hydrochlorothiazide 25 MG tablet Commonly known as: HYDRODIURIL   ibuprofen 200 MG tablet Commonly known as: ADVIL   losartan 50 MG tablet Commonly known as: COZAAR     TAKE these medications   albuterol 108 (90 Base) MCG/ACT inhaler Commonly known as: VENTOLIN HFA Inhale 2 puffs into the lungs every 4 (four) hours as needed.   ciprofloxacin 500 MG tablet Commonly known as: Cipro Take 1 tablet (500 mg total) by mouth 2 (two) times daily for 4 days.   omeprazole 20 MG tablet Commonly known as: PRILOSEC OTC Take 20 mg by mouth daily.   venlafaxine XR 150 MG 24 hr capsule Commonly known as: EFFEXOR-XR Take 150 mg by mouth daily with breakfast.       Allergies  Allergen Reactions  . Lisinopril Cough    Consultations:  Nephrology   Procedures/Studies: US RENAL  Result Date: 12/03/2019 CLINICAL DATA:  Acute kidney insufficiency. EXAM: RENAL / URINARY TRACT ULTRASOUND COMPLETE COMPARISON:  None. FINDINGS: Right Kidney: Renal measurements: 12.2 x 6.1 x 6.2 cm = volume: 240 mL . Echogenicity within normal limits. No mass or hydronephrosis visualized. Left Kidney: Renal measurements: 11.8 x 6.8 x 6.6 cm = volume: 277 mL. Echogenicity within normal limits.  No mass or hydronephrosis visualized. Bladder: Appears normal for degree of bladder distention. Other: The liver is diffusely echogenic and somewhat heterogeneous. No discrete lesion is present. IMPRESSION: 1. Normal sonographic appearance of the kidneys bilaterally. No obstruction or mass lesion. 2. Liver is diffusely echogenic, suggesting hepatic steatosis. Electronically Signed   By: Marin Roberts M.D.   On: 12/03/2019 09:10   DG Chest Portable 1 View  Result Date: 12/02/2019 CLINICAL DATA:  Shortness of breath EXAM:  PORTABLE CHEST 1 VIEW COMPARISON:  2017 FINDINGS: The heart size and mediastinal contours are within normal limits. Both lungs are clear. No pleural effusion or pneumothorax. The visualized skeletal structures are unremarkable. IMPRESSION: No active disease. Electronically Signed   By: Guadlupe Spanish M.D.   On: 12/02/2019 17:05       Subjective: Diarrhea resolved.  No nausea vomiting or abdominal pain.  Feels much better.  Discharge Exam: Vitals:   12/05/19 0018 12/05/19 0901  BP: 126/80 113/66  Pulse: 66 72  Resp: 16 17  Temp: 98.3 F (36.8 C) 98.5 F (36.9 C)  SpO2: 100% 98%   Vitals:   12/04/19 0832 12/04/19 1533 12/05/19 0018 12/05/19 0901  BP: 115/68 101/67 126/80 113/66  Pulse: 66 76 66 72  Resp: Temp: 99 F (37.2 C) 98.2 F (36.8 C) 98.3 F (36.8 C) 98.5 F (36.9 C)  TempSrc: Oral Oral Oral   SpO2: 99% 98% 100% 98%  Weight:      Height:        General: Pt is alert, awake, not in acute distress Cardiovascular: RRR, S1/S2 +, no rubs, no gallops Respiratory: CTA bilaterally, no wheezing, no rhonchi Abdominal: Soft, NT, ND, bowel sounds + Extremities: no edema, no cyanosis    The results of significant diagnostics from this hospitalization (including imaging, microbiology, ancillary and laboratory) are listed below for reference.     Microbiology: Recent Results (from the past 240 hour(s))  Culture, blood (routine x 2)     Status: None (Preliminary result)   Collection Time: 12/02/19  4:47 PM   Specimen: BLOOD  Result Value Ref Range Status   Specimen Description BLOOD LEFT ANTECUBITAL  Final   Special Requests   Final    BOTTLES DRAWN AEROBIC AND ANAEROBIC Blood Culture adequate volume   Culture   Final    NO GROWTH 3 DAYS Performed at [facility], [address], [place], [place] [address]    Report Status PENDING  Incomplete  Culture, blood (routine x 2)     Status: None (Preliminary result)   Collection Time: 12/02/19  4:49  PM   Specimen: BLOOD  Result Value Ref Range Status   Specimen Description BLOOD BLOOD RIGHT ARM  Final   Special Requests   Final    BOTTLES DRAWN AEROBIC AND ANAEROBIC Blood Culture adequate volume   Culture   Final    NO GROWTH 3 DAYS Performed at [facility], [address], [place], [place] [address]    Report Status PENDING  Incomplete  Respiratory Panel by RT PCR (Flu A&B, Covid) - Nasopharyngeal Swab     Status: None   Collection Time: 12/02/19  6:41 PM   Specimen: Nasopharyngeal Swab  Result Value Ref Range Status   SARS Coronavirus 2 by RT PCR NEGATIVE NEGATIVE Final    Comment: (NOTE) SARS-CoV-2 target nucleic acids are NOT DETECTED. The SARS-CoV-2 RNA is generally detectable in upper respiratoy specimens during the acute phase of infection. The  lowest concentration of SARS-CoV-2 viral copies this assay can detect is 131 copies/mL. A negative result does not preclude SARS-Cov-2 infection and should not be used as the sole basis for treatment or other patient management decisions. A negative result may occur with  improper specimen collection/handling, submission of specimen other than nasopharyngeal swab, presence of viral mutation(s) within the areas targeted by this assay, and inadequate number of viral copies (<131 copies/mL). A negative result must be combined with clinical observations, patient history, and epidemiological information. The expected result is Negative. Fact Sheet for Patients:  https://www.moore.com/ Fact Sheet for Healthcare Providers:  https://www.young.biz/ This test is not yet ap proved or cleared by the [place] FDA and  has been authorized for detection and/or diagnosis of SARS-CoV-2 by FDA under an Emergency Use Authorization (EUA). This EUA will remain  in effect (meaning this test can be used) for the duration of the COVID-19 declaration under Section 564(b)(1) of the Act, 21  U.S.C. section 360bbb-3(b)(1), unless the authorization is terminated or revoked sooner.    Influenza A by PCR NEGATIVE NEGATIVE Final   Influenza B by PCR NEGATIVE NEGATIVE Final    Comment: (NOTE) The Xpert Xpress SARS-CoV-2/FLU/RSV assay is intended as an aid in  the diagnosis of influenza from Nasopharyngeal swab specimens and  should not be used as a sole basis for treatment. Nasal washings and  aspirates are unacceptable for Xpert Xpress SARS-CoV-2/FLU/RSV  testing. Fact Sheet for Patients: https://www.moore.com/ Fact Sheet for Healthcare Providers: https://www.young.biz/ This test is not yet approved or cleared by the [place] FDA and  has been authorized for detection and/or diagnosis of SARS-CoV-2 by  FDA under an Emergency Use Authorization (EUA). This EUA will remain  in effect (meaning this test can be used) for the duration of the  Covid-19 declaration under Section 564(b)(1) of the Act, 21  U.S.C. section 360bbb-3(b)(1), unless the authorization is  terminated or revoked. Performed at [facility], [address], [place], [place] [address]   Urine culture     Status: None   Collection Time: 12/02/19 11:09 PM   Specimen: In/Out Cath Urine  Result Value Ref Range Status   Specimen Description   Final    IN/OUT CATH URINE Performed at [facility], [address], [place], [place] [address]    Special Requests   Final    NONE Performed at [facility], [address], [place], [place] [address]    Culture   Final    NO GROWTH Performed at [facility], [address] [place], [place] [address]    Report Status 12/03/2019 FINAL  Final  GI pathogen panel by PCR, stool     Status: Abnormal   Collection Time: 12/02/19 11:09 PM   Specimen: Urine, Clean Catch; Stool  Result Value Ref Range Status   Plesiomonas shigelloides NOT DETECTED NOT DETECTED Final   Yersinia enterocolitica NOT  DETECTED NOT DETECTED Final   Vibrio NOT DETECTED NOT DETECTED Final   Enteropathogenic E coli NOT DETECTED NOT DETECTED Final   E coli (ETEC) LT/ST NOT DETECTED NOT DETECTED Final   E coli 0157 by PCR Not applicable NOT DETECTED Final   Cryptosporidium by PCR NOT DETECTED NOT DETECTED Final   Entamoeba histolytica NOT DETECTED NOT DETECTED Final   Adenovirus F 40/41 NOT DETECTED NOT DETECTED Final   Norovirus GI/GII NOT DETECTED NOT DETECTED Final   Sapovirus NOT DETECTED NOT DETECTED Final    Comment: (NOTE) Performed At:  [facility] [address] [place], [place] [id number] Jolene Schimke MD SP:[id number]    Vibrio cholerae NOT DETECTED NOT DETECTED Final   Campylobacter by PCR NOT DETECTED NOT DETECTED Final   Salmonella by PCR DETECTED (A) NOT DETECTED Final   E coli (STEC) NOT DETECTED NOT DETECTED Final   Enteroaggregative E coli NOT DETECTED NOT DETECTED Final   Shigella by PCR NOT DETECTED NOT DETECTED Final   Cyclospora cayetanensis NOT DETECTED NOT DETECTED Final   Astrovirus NOT DETECTED NOT DETECTED Final   G lamblia by PCR NOT DETECTED NOT DETECTED Final   Rotavirus A by PCR NOT DETECTED NOT DETECTED Final  C Difficile Quick Screen w PCR reflex     Status: None   Collection Time: 12/02/19 11:09 PM   Specimen: Urine, Clean Catch; Stool  Result Value Ref Range Status   C Diff antigen NEGATIVE NEGATIVE Final   C Diff toxin NEGATIVE NEGATIVE Final   C Diff interpretation No C. difficile detected.  Final    Comment: Performed at [facility], [address] [place], [place] [address]  Stool culture     Status: None (Preliminary result)   Collection Time: 12/02/19 11:09 PM   Specimen: Urine, Clean Catch; Stool  Result Value Ref Range Status   Salmonella/Shigella Screen PENDING  Incomplete   Campylobacter Culture PENDING  Incomplete   E coli, Shiga toxin Assay Negative Negative Final    Comment: (NOTE) Performed At: [facility] [address] [place], [place] [id number] Jolene Schimke MD TD:[id number]      Labs: BNP (last 3 results) No results for input(s): BNP in the last 8760 hours. Basic Metabolic Panel: Recent Labs  Lab 12/02/19 1648 12/02/19 1852 12/03/19 0443 12/04/19 0630 12/05/19 0426  NA 127*  --  131* 137 138  K 3.2*  --  3.3* 3.3* 3.4*  CL 88*  --  100 107 107  CO2 16*  --  17* 22 22  GLUCOSE 174*  --  127* 121* 112*  BUN 74*  --  68* 26* 13  CREATININE 7.80*  --  3.92* 0.63 0.42*  CALCIUM 8.4*  --  8.1* 8.3* 8.3*  MG  --  2.1  --  2.2  --    Liver Function Tests: Recent Labs  Lab 12/02/19 1648  AST 25  ALT 29  ALKPHOS 45  BILITOT 0.7  PROT 8.8*  ALBUMIN 4.0   Recent Labs  Lab 12/02/19 1648  LIPASE 113*   No results for input(s): AMMONIA in the last 168 hours. CBC: Recent Labs  Lab 12/02/19 1648 12/03/19 0443 12/04/19 0630  WBC 11.6* 7.1 5.4  NEUTROABS 8.8*  --  3.6  HGB 16.9* 14.1 13.1  HCT 47.5* 39.8 37.5  MCV 89.1 90.9 90.6  PLT 278 206 202   Cardiac Enzymes: No results for input(s): CKTOTAL, CKMB, CKMBINDEX, TROPONINI in the last 168 hours. BNP: Invalid input(s): POCBNP CBG: No results for input(s): GLUCAP in the last 168 hours. D-Dimer No results for input(s): DDIMER in the last 72 hours. Hgb A1c No results for input(s): HGBA1C in the last 72 hours. Lipid Profile No results for input(s): CHOL, HDL, LDLCALC, TRIG, CHOLHDL, LDLDIRECT in the last 72 hours. Thyroid function studies No results for input(s): TSH, T4TOTAL, T3FREE, THYROIDAB in the last 72 hours.  Invalid input(s): FREET3 Anemia work up No results for input(s): VITAMINB12, FOLATE, FERRITIN, TIBC, IRON, RETICCTPCT in the last 72 hours. Urinalysis    Component Value Date/Time  COLORURINE YELLOW (A) 12/02/2019 2309   APPEARANCEUR HAZY (A) 12/02/2019 2309   LABSPEC 1.010 12/02/2019 2309   PHURINE 5.0 12/02/2019 2309   GLUCOSEU NEGATIVE 12/02/2019 2309   HGBUR NEGATIVE 12/02/2019 2309   BILIRUBINUR  NEGATIVE 12/02/2019 2309   KETONESUR NEGATIVE 12/02/2019 2309   PROTEINUR NEGATIVE 12/02/2019 2309   NITRITE NEGATIVE 12/02/2019 2309   LEUKOCYTESUR NEGATIVE 12/02/2019 2309   Sepsis Labs Invalid input(s): PROCALCITONIN,  WBC,  LACTICIDVEN Microbiology Recent Results (from the past 240 hour(s))  Culture, blood (routine x 2)     Status: None (Preliminary result)   Collection Time: 12/02/19  4:47 PM   Specimen: BLOOD  Result Value Ref Range Status   Specimen Description BLOOD LEFT ANTECUBITAL  Final   Special Requests   Final    BOTTLES DRAWN AEROBIC AND ANAEROBIC Blood Culture adequate volume   Culture   Final    NO GROWTH 3 DAYS Performed at [facility], [address], [place], [place] [address]    Report Status PENDING  Incomplete  Culture, blood (routine x 2)     Status: None (Preliminary result)   Collection Time: 12/02/19  4:49 PM   Specimen: BLOOD  Result Value Ref Range Status   Specimen Description BLOOD BLOOD RIGHT ARM  Final   Special Requests   Final    BOTTLES DRAWN AEROBIC AND ANAEROBIC Blood Culture adequate volume   Culture   Final    NO GROWTH 3 DAYS Performed at [facility], [address] [place], [place] [address]    Report Status PENDING  Incomplete  Respiratory Panel by RT PCR (Flu A&B, Covid) - Nasopharyngeal Swab     Status: None   Collection Time: 12/02/19  6:41 PM   Specimen: Nasopharyngeal Swab  Result Value Ref Range Status   SARS Coronavirus 2 by RT PCR NEGATIVE NEGATIVE Final    Comment: (NOTE) SARS-CoV-2 target nucleic acids are NOT DETECTED. The SARS-CoV-2 RNA is generally detectable in upper respiratoy specimens during the acute phase of infection. The lowest concentration of SARS-CoV-2 viral copies this assay can detect is 131 copies/mL. A negative result does not preclude SARS-Cov-2 infection and should not be used as the sole basis for treatment or other patient management decisions. A negative result may occur  with  improper specimen collection/handling, submission of specimen other than nasopharyngeal swab, presence of viral mutation(s) within the areas targeted by this assay, and inadequate number of viral copies (<131 copies/mL). A negative result must be combined with clinical observations, patient history, and epidemiological information. The expected result is Negative. Fact Sheet for Patients:  PinkCheek.be Fact Sheet for Healthcare Providers:  GravelBags.it This test is not yet ap proved or cleared by the [place] FDA and  has been authorized for detection and/or diagnosis of SARS-CoV-2 by FDA under an Emergency Use Authorization (EUA). This EUA will remain  in effect (meaning this test can be used) for the duration of the COVID-19 declaration under Section 564(b)(1) of the Act, 21 U.S.C. section 360bbb-3(b)(1), unless the authorization is terminated or revoked sooner.    Influenza A by PCR NEGATIVE NEGATIVE Final   Influenza B by PCR NEGATIVE NEGATIVE Final    Comment: (NOTE) The Xpert Xpress SARS-CoV-2/FLU/RSV assay is intended as an aid in  the diagnosis of influenza from Nasopharyngeal swab specimens and  should not be used as a sole basis for treatment. Nasal washings and  aspirates are unacceptable for Xpert Xpress SARS-CoV-2/FLU/RSV  testing. Fact Sheet for Patients:  https://www.moore.com/ Fact Sheet for Healthcare Providers: https://www.young.biz/ This test is not yet approved or cleared by the [place] FDA and  has been authorized for detection and/or diagnosis of SARS-CoV-2 by  FDA under an Emergency Use Authorization (EUA). This EUA will remain  in effect (meaning this test can be used) for the duration of the  Covid-19 declaration under Section 564(b)(1) of the Act, 21  U.S.C. section 360bbb-3(b)(1), unless the authorization is  terminated or revoked. Performed  at [facility], [address], [place], [place] [address]   Urine culture     Status: None   Collection Time: 12/02/19 11:09 PM   Specimen: In/Out Cath Urine  Result Value Ref Range Status   Specimen Description   Final    IN/OUT CATH URINE Performed at [facility], [address] [place], [place] [address]    Special Requests   Final    NONE Performed at [facility], [address], [place], [place] [address]    Culture   Final    NO GROWTH Performed at [facility], [address] [place], [place] [address]    Report Status 12/03/2019 FINAL  Final  GI pathogen panel by PCR, stool     Status: Abnormal   Collection Time: 12/02/19 11:09 PM   Specimen: Urine, Clean Catch; Stool  Result Value Ref Range Status   Plesiomonas shigelloides NOT DETECTED NOT DETECTED Final   Yersinia enterocolitica NOT DETECTED NOT DETECTED Final   Vibrio NOT DETECTED NOT DETECTED Final   Enteropathogenic E coli NOT DETECTED NOT DETECTED Final   E coli (ETEC) LT/ST NOT DETECTED NOT DETECTED Final   E coli 0157 by PCR Not applicable NOT DETECTED Final   Cryptosporidium by PCR NOT DETECTED NOT DETECTED Final   Entamoeba histolytica NOT DETECTED NOT DETECTED Final   Adenovirus F 40/41 NOT DETECTED NOT DETECTED Final   Norovirus GI/GII NOT DETECTED NOT DETECTED Final   Sapovirus NOT DETECTED NOT DETECTED Final    Comment: (NOTE) Performed At: [facility] [address] [place], [place] [id number] Jolene Schimke MD [id number]    Vibrio cholerae NOT DETECTED NOT DETECTED Final   Campylobacter by PCR NOT DETECTED NOT DETECTED Final   Salmonella by PCR DETECTED (A) NOT DETECTED Final   E coli (STEC) NOT DETECTED NOT DETECTED Final   Enteroaggregative E coli NOT DETECTED NOT DETECTED Final   Shigella by PCR NOT DETECTED NOT DETECTED Final   Cyclospora cayetanensis NOT DETECTED NOT DETECTED Final   Astrovirus NOT DETECTED NOT DETECTED Final   G  lamblia by PCR NOT DETECTED NOT DETECTED Final   Rotavirus A by PCR NOT DETECTED NOT DETECTED Final  C Difficile Quick Screen w PCR reflex     Status: None   Collection Time: 12/02/19 11:09 PM   Specimen: Urine, Clean Catch; Stool  Result Value Ref Range Status   C Diff antigen NEGATIVE NEGATIVE Final   C Diff toxin NEGATIVE NEGATIVE Final   C Diff interpretation No C. difficile detected.  Final    Comment: Performed at [facility], [address] [place], [place] [address]  Stool culture     Status: None (Preliminary result)   Collection Time: 12/02/19 11:09 PM   Specimen: Urine, Clean Catch; Stool  Result Value Ref Range Status   Salmonella/Shigella Screen PENDING  Incomplete   Campylobacter Culture PENDING  Incomplete   E coli, Shiga toxin Assay Negative Negative Final    Comment: (NOTE)  Performed At: [facility] [address] [place], [place] [id number] Jolene Schimke MD WU:[id number]    1. Acute kidney injury.This is likelyprerenal andmultifactorial secondary to intractable nausea and vomiting with subsequent dehydration as well as diuretic therapy with HCTZ as well as ARB therapy and NSAIDs. Renal function has improved with IV fluids Initial creatinine was 7.80, now 0.42 Avoid nephrotoxin meds Nephrology signed off, thank you for input     2. Acute Salmonella gastroenteritis with subsequent sepsis as manifested by hypotension and tachycardia as well as leukocytosis. He has no severe sepsis or shock. -Given associated sepsis, will continue antibiotic therapy with IV Rocephin and Flagyl. Improved.  We will switch her to ciprofloxacin and metronidazole p.o.   3.Hypotension with history of hypertension. -We willhold all his antihypertensives.  Told patient to follow-up with PCP for BP management since all her meds have been held and will be held on discharge due to low blood pressure.  4. Hypokalemia, hyponatremia and  hypochloremia. -This is clearly secondary to dehydration , volume depletion, and hypokalemia   5. Depression. -Effexor XR will be continued.  Time coordinating discharge: Over 30 minutes  SIGNED:   Lynn Ito, MD  [facility] 12/05/2019, 1:03 PM Pager   If 7PM-7AM, please contact night-coverage [email or web address] Password [email or web address]

## 2019-12-05 NOTE — Plan of Care (Signed)

## 2019-12-07 LAB — STOOL CULTURE REFLEX - RSASHR

## 2019-12-07 LAB — CULTURE, BLOOD (ROUTINE X 2)
Culture: NO GROWTH
Culture: NO GROWTH
Special Requests: ADEQUATE
Special Requests: ADEQUATE

## 2019-12-07 LAB — STOOL CULTURE: E coli, Shiga toxin Assay: NEGATIVE

## 2019-12-07 LAB — STOOL CULTURE REFLEX - CMPCXR

## 2020-12-31 ENCOUNTER — Other Ambulatory Visit (HOSPITAL_COMMUNITY): Payer: Self-pay

## 2020-12-31 MED ORDER — TRIAMCINOLONE ACETONIDE 0.1 % EX LOTN
TOPICAL_LOTION | CUTANEOUS | 0 refills | Status: DC
  Filled 2020-12-31: qty 60, 14d supply, fill #0

## 2020-12-31 MED ORDER — AMLODIPINE BESYLATE 5 MG PO TABS
ORAL_TABLET | ORAL | 3 refills | Status: DC
  Filled 2020-12-31: qty 30, 30d supply, fill #0
  Filled 2021-05-28: qty 30, 30d supply, fill #1

## 2020-12-31 MED ORDER — LOSARTAN POTASSIUM 50 MG PO TABS
ORAL_TABLET | ORAL | 3 refills | Status: DC
  Filled 2020-12-31 – 2021-04-29 (×2): qty 30, 30d supply, fill #0
  Filled 2021-05-28: qty 30, 30d supply, fill #1

## 2021-01-01 ENCOUNTER — Other Ambulatory Visit (HOSPITAL_COMMUNITY): Payer: Self-pay

## 2021-01-04 ENCOUNTER — Other Ambulatory Visit (HOSPITAL_COMMUNITY): Payer: Self-pay

## 2021-01-04 MED ORDER — METFORMIN HCL ER 500 MG PO TB24
ORAL_TABLET | ORAL | 0 refills | Status: DC
  Filled 2021-01-04: qty 60, 30d supply, fill #0

## 2021-01-18 ENCOUNTER — Other Ambulatory Visit (HOSPITAL_COMMUNITY): Payer: Self-pay

## 2021-01-25 ENCOUNTER — Other Ambulatory Visit (HOSPITAL_COMMUNITY): Payer: Self-pay

## 2021-01-25 MED ORDER — METFORMIN HCL ER 500 MG PO TB24
ORAL_TABLET | ORAL | 0 refills | Status: DC
  Filled 2021-01-29: qty 180, 90d supply, fill #0

## 2021-01-25 MED ORDER — AMLODIPINE BESYLATE 5 MG PO TABS
ORAL_TABLET | ORAL | 3 refills | Status: DC
  Filled 2021-01-29: qty 90, 90d supply, fill #0
  Filled 2021-04-29: qty 90, 90d supply, fill #1
  Filled 2021-04-29: qty 90, 90d supply, fill #0
  Filled 2021-07-25 – 2021-08-05 (×2): qty 90, 90d supply, fill #1

## 2021-01-25 MED ORDER — LOSARTAN POTASSIUM 50 MG PO TABS
ORAL_TABLET | ORAL | 3 refills | Status: DC
  Filled 2021-01-29: qty 90, 90d supply, fill #0
  Filled 2021-04-29: qty 90, 90d supply, fill #1

## 2021-01-29 ENCOUNTER — Other Ambulatory Visit (HOSPITAL_COMMUNITY): Payer: Self-pay

## 2021-02-08 ENCOUNTER — Other Ambulatory Visit (HOSPITAL_COMMUNITY): Payer: Self-pay

## 2021-02-08 MED ORDER — VENLAFAXINE HCL ER 150 MG PO CP24
ORAL_CAPSULE | ORAL | 3 refills | Status: DC
  Filled 2021-02-08: qty 30, 30d supply, fill #0

## 2021-03-09 ENCOUNTER — Other Ambulatory Visit (HOSPITAL_COMMUNITY): Payer: Self-pay

## 2021-03-09 MED ORDER — VENLAFAXINE HCL ER 150 MG PO CP24
ORAL_CAPSULE | ORAL | 3 refills | Status: DC
  Filled 2021-03-09 – 2021-06-08 (×2): qty 90, 90d supply, fill #0
  Filled 2021-06-08 – 2021-09-07 (×2): qty 90, 90d supply, fill #1
  Filled 2021-12-09: qty 90, 90d supply, fill #0

## 2021-03-10 ENCOUNTER — Other Ambulatory Visit (HOSPITAL_COMMUNITY): Payer: Self-pay

## 2021-04-02 ENCOUNTER — Other Ambulatory Visit (HOSPITAL_COMMUNITY): Payer: Self-pay

## 2021-04-05 ENCOUNTER — Encounter: Payer: Self-pay | Admitting: Oncology

## 2021-04-05 ENCOUNTER — Other Ambulatory Visit (HOSPITAL_COMMUNITY): Payer: Self-pay

## 2021-04-05 MED ORDER — JARDIANCE 10 MG PO TABS
10.0000 mg | ORAL_TABLET | Freq: Every day | ORAL | 3 refills | Status: DC
  Filled 2021-04-05: qty 30, 30d supply, fill #0

## 2021-04-29 ENCOUNTER — Other Ambulatory Visit (HOSPITAL_COMMUNITY): Payer: Self-pay

## 2021-05-11 ENCOUNTER — Other Ambulatory Visit (HOSPITAL_COMMUNITY): Payer: Self-pay

## 2021-05-11 MED ORDER — JARDIANCE 10 MG PO TABS
10.0000 mg | ORAL_TABLET | Freq: Every day | ORAL | 3 refills | Status: DC
  Filled 2021-05-11: qty 90, 90d supply, fill #0
  Filled 2021-08-05: qty 90, 90d supply, fill #1

## 2021-05-21 ENCOUNTER — Other Ambulatory Visit (HOSPITAL_COMMUNITY): Payer: Self-pay

## 2021-05-21 MED ORDER — LOSARTAN POTASSIUM 50 MG PO TABS
50.0000 mg | ORAL_TABLET | Freq: Every day | ORAL | 3 refills | Status: DC
  Filled 2021-05-21 – 2021-06-08 (×3): qty 90, 90d supply, fill #0
  Filled 2021-09-07: qty 90, 90d supply, fill #1
  Filled 2021-12-09: qty 90, 90d supply, fill #0
  Filled 2022-02-24: qty 90, 90d supply, fill #1

## 2021-05-28 ENCOUNTER — Other Ambulatory Visit (HOSPITAL_COMMUNITY): Payer: Self-pay

## 2021-06-07 ENCOUNTER — Other Ambulatory Visit (HOSPITAL_COMMUNITY): Payer: Self-pay

## 2021-06-08 ENCOUNTER — Other Ambulatory Visit (HOSPITAL_COMMUNITY): Payer: Self-pay

## 2021-06-15 ENCOUNTER — Other Ambulatory Visit (HOSPITAL_BASED_OUTPATIENT_CLINIC_OR_DEPARTMENT_OTHER): Payer: Self-pay | Admitting: Internal Medicine

## 2021-06-23 ENCOUNTER — Other Ambulatory Visit (HOSPITAL_COMMUNITY): Payer: Self-pay

## 2021-06-23 MED ORDER — COLCHICINE 0.6 MG PO TABS
ORAL_TABLET | ORAL | 1 refills | Status: DC
  Filled 2021-06-23: qty 21, 10d supply, fill #0

## 2021-07-26 ENCOUNTER — Other Ambulatory Visit (HOSPITAL_COMMUNITY): Payer: Self-pay

## 2021-08-03 ENCOUNTER — Other Ambulatory Visit (HOSPITAL_COMMUNITY): Payer: Self-pay

## 2021-08-05 ENCOUNTER — Other Ambulatory Visit (HOSPITAL_COMMUNITY): Payer: Self-pay

## 2021-08-18 ENCOUNTER — Ambulatory Visit (HOSPITAL_BASED_OUTPATIENT_CLINIC_OR_DEPARTMENT_OTHER): Payer: No Typology Code available for payment source

## 2021-09-07 ENCOUNTER — Other Ambulatory Visit (HOSPITAL_COMMUNITY): Payer: Self-pay

## 2021-09-13 ENCOUNTER — Other Ambulatory Visit (HOSPITAL_COMMUNITY): Payer: Self-pay

## 2021-09-13 MED ORDER — BUPROPION HCL ER (SR) 150 MG PO TB12
150.0000 mg | ORAL_TABLET | Freq: Every morning | ORAL | 3 refills | Status: DC
  Filled 2021-09-13 – 2021-12-09 (×2): qty 90, 90d supply, fill #0
  Filled 2022-02-24: qty 90, 90d supply, fill #1
  Filled 2022-05-30: qty 90, 90d supply, fill #2

## 2021-10-11 ENCOUNTER — Other Ambulatory Visit (HOSPITAL_COMMUNITY): Payer: Self-pay

## 2021-10-11 MED ORDER — JARDIANCE 25 MG PO TABS
25.0000 mg | ORAL_TABLET | Freq: Every day | ORAL | 3 refills | Status: DC
  Filled 2021-10-11 – 2022-01-20 (×2): qty 90, 90d supply, fill #0

## 2021-10-22 ENCOUNTER — Other Ambulatory Visit (HOSPITAL_COMMUNITY): Payer: Self-pay

## 2021-10-22 MED ORDER — PROMETHAZINE HCL 6.25 MG/5ML PO SYRP
12.5000 mg | ORAL_SOLUTION | Freq: Four times a day (QID) | ORAL | 0 refills | Status: DC | PRN
  Filled 2021-10-22: qty 200, 5d supply, fill #0

## 2021-11-22 ENCOUNTER — Other Ambulatory Visit: Payer: Self-pay

## 2021-11-22 MED ORDER — DOXYCYCLINE HYCLATE 100 MG PO TABS
100.0000 mg | ORAL_TABLET | Freq: Two times a day (BID) | ORAL | 0 refills | Status: DC
  Filled 2021-11-22: qty 20, 10d supply, fill #0

## 2021-11-22 MED ORDER — AMLODIPINE BESYLATE 5 MG PO TABS
ORAL_TABLET | ORAL | 3 refills | Status: DC
  Filled 2021-11-22: qty 90, 90d supply, fill #0
  Filled 2022-02-24: qty 90, 90d supply, fill #1
  Filled 2022-05-27: qty 90, 90d supply, fill #2
  Filled 2022-09-01: qty 90, 90d supply, fill #3

## 2021-12-09 ENCOUNTER — Other Ambulatory Visit: Payer: Self-pay

## 2022-01-14 ENCOUNTER — Other Ambulatory Visit: Payer: Self-pay

## 2022-01-14 MED ORDER — LOSARTAN POTASSIUM 50 MG PO TABS
ORAL_TABLET | ORAL | 3 refills | Status: DC
  Filled 2022-01-14: qty 90, 90d supply, fill #0

## 2022-01-17 ENCOUNTER — Other Ambulatory Visit: Payer: Self-pay

## 2022-01-17 MED ORDER — ATORVASTATIN CALCIUM 10 MG PO TABS
ORAL_TABLET | ORAL | 0 refills | Status: DC
  Filled 2022-01-17 – 2022-01-24 (×2): qty 90, 90d supply, fill #0

## 2022-01-20 ENCOUNTER — Other Ambulatory Visit: Payer: Self-pay

## 2022-01-24 ENCOUNTER — Other Ambulatory Visit: Payer: Self-pay

## 2022-02-01 ENCOUNTER — Encounter: Payer: Self-pay | Admitting: Oncology

## 2022-02-01 ENCOUNTER — Other Ambulatory Visit: Payer: Self-pay | Admitting: Family Medicine

## 2022-02-01 DIAGNOSIS — Z1231 Encounter for screening mammogram for malignant neoplasm of breast: Secondary | ICD-10-CM

## 2022-02-02 ENCOUNTER — Ambulatory Visit
Admission: RE | Admit: 2022-02-02 | Discharge: 2022-02-02 | Disposition: A | Discharge status: Home / Self Care | Payer: No Typology Code available for payment source | Source: Ambulatory Visit | Attending: Family Medicine | Admitting: Family Medicine

## 2022-02-02 DIAGNOSIS — Z1231 Encounter for screening mammogram for malignant neoplasm of breast: Secondary | ICD-10-CM | POA: Diagnosis present

## 2022-02-24 ENCOUNTER — Other Ambulatory Visit: Payer: Self-pay

## 2022-02-25 ENCOUNTER — Other Ambulatory Visit: Payer: Self-pay

## 2022-02-25 MED ORDER — VENLAFAXINE HCL ER 150 MG PO CP24
ORAL_CAPSULE | ORAL | 3 refills | Status: DC
  Filled 2022-02-25: qty 90, 90d supply, fill #0

## 2022-03-03 ENCOUNTER — Other Ambulatory Visit: Payer: Self-pay

## 2022-03-04 ENCOUNTER — Other Ambulatory Visit: Payer: Self-pay

## 2022-03-04 MED ORDER — VENLAFAXINE HCL ER 150 MG PO CP24
ORAL_CAPSULE | ORAL | 3 refills | Status: DC
  Filled 2022-03-04: qty 90, 90d supply, fill #0
  Filled 2022-05-30: qty 90, 90d supply, fill #1
  Filled 2022-09-01: qty 90, 90d supply, fill #2
  Filled 2022-12-06: qty 90, 90d supply, fill #3

## 2022-04-21 ENCOUNTER — Other Ambulatory Visit: Payer: Self-pay

## 2022-04-21 MED ORDER — JARDIANCE 25 MG PO TABS
ORAL_TABLET | ORAL | 3 refills | Status: DC
  Filled 2022-04-21: qty 90, 90d supply, fill #0
  Filled 2022-07-27: qty 90, 90d supply, fill #1

## 2022-04-21 MED ORDER — ATORVASTATIN CALCIUM 10 MG PO TABS
ORAL_TABLET | ORAL | 3 refills | Status: DC
  Filled 2022-04-21: qty 90, 90d supply, fill #0
  Filled 2022-07-27: qty 90, 90d supply, fill #1
  Filled 2022-11-01: qty 90, 90d supply, fill #2

## 2022-04-22 ENCOUNTER — Other Ambulatory Visit: Payer: Self-pay

## 2022-05-18 ENCOUNTER — Other Ambulatory Visit: Payer: Self-pay

## 2022-05-18 MED ORDER — OZEMPIC (0.25 OR 0.5 MG/DOSE) 2 MG/3ML ~~LOC~~ SOPN
PEN_INJECTOR | SUBCUTANEOUS | 1 refills | Status: AC
  Filled 2022-05-18: qty 3, 42d supply, fill #0
  Filled 2022-09-21: qty 3, 28d supply, fill #0

## 2022-05-25 ENCOUNTER — Other Ambulatory Visit: Payer: Self-pay

## 2022-05-27 ENCOUNTER — Other Ambulatory Visit: Payer: Self-pay

## 2022-05-30 ENCOUNTER — Other Ambulatory Visit: Payer: Self-pay

## 2022-05-30 MED ORDER — LOSARTAN POTASSIUM 50 MG PO TABS
50.0000 mg | ORAL_TABLET | Freq: Every day | ORAL | 3 refills | Status: DC
  Filled 2022-05-30: qty 90, 90d supply, fill #0
  Filled 2022-09-01: qty 90, 90d supply, fill #1
  Filled 2023-01-04: qty 90, 90d supply, fill #2

## 2022-05-30 MED ORDER — AMLODIPINE BESYLATE 5 MG PO TABS: 5.0000 mg | ORAL_TABLET | Freq: Every day | ORAL | 3 refills | Status: DC

## 2022-06-27 ENCOUNTER — Other Ambulatory Visit: Payer: Self-pay

## 2022-06-27 MED ORDER — AMOXICILLIN-POT CLAVULANATE 875-125 MG PO TABS
1.0000 | ORAL_TABLET | Freq: Two times a day (BID) | ORAL | 0 refills | Status: AC
  Filled 2022-06-27: qty 14, 7d supply, fill #0

## 2022-06-30 ENCOUNTER — Other Ambulatory Visit: Payer: Self-pay

## 2022-07-27 ENCOUNTER — Other Ambulatory Visit: Payer: Self-pay

## 2022-08-16 ENCOUNTER — Other Ambulatory Visit: Payer: Self-pay

## 2022-08-16 MED ORDER — MELOXICAM 15 MG PO TABS
15.0000 mg | ORAL_TABLET | Freq: Every day | ORAL | 0 refills | Status: DC
  Filled 2022-08-16: qty 30, 30d supply, fill #0

## 2022-09-01 ENCOUNTER — Other Ambulatory Visit: Payer: Self-pay

## 2022-09-02 ENCOUNTER — Other Ambulatory Visit: Payer: Self-pay

## 2022-09-02 MED ORDER — BUPROPION HCL ER (SR) 150 MG PO TB12
150.0000 mg | ORAL_TABLET | Freq: Every day | ORAL | 3 refills | Status: DC
  Filled 2022-09-02: qty 90, 90d supply, fill #0

## 2022-09-08 ENCOUNTER — Other Ambulatory Visit: Payer: Self-pay

## 2022-09-21 ENCOUNTER — Other Ambulatory Visit: Payer: Self-pay

## 2022-09-21 MED ORDER — OZEMPIC (1 MG/DOSE) 4 MG/3ML ~~LOC~~ SOPN
1.0000 mg | PEN_INJECTOR | SUBCUTANEOUS | 1 refills | Status: DC
  Filled 2022-09-21: qty 3, 28d supply, fill #0
  Filled 2022-09-21: qty 3, 30d supply, fill #0
  Filled 2022-09-28: qty 3, 28d supply, fill #0

## 2022-09-28 ENCOUNTER — Encounter: Payer: Self-pay | Admitting: Oncology

## 2022-09-28 ENCOUNTER — Other Ambulatory Visit: Payer: Self-pay

## 2022-10-05 ENCOUNTER — Other Ambulatory Visit: Payer: Self-pay

## 2022-10-07 ENCOUNTER — Other Ambulatory Visit: Payer: Self-pay

## 2022-10-07 MED ORDER — PREDNISONE 10 MG (21) PO TBPK
ORAL_TABLET | ORAL | 0 refills | Status: AC
  Filled 2022-10-07: qty 21, 6d supply, fill #0

## 2022-10-13 ENCOUNTER — Other Ambulatory Visit: Payer: Self-pay

## 2022-10-17 ENCOUNTER — Other Ambulatory Visit: Payer: Self-pay

## 2022-10-28 ENCOUNTER — Other Ambulatory Visit: Payer: Self-pay

## 2022-10-28 MED ORDER — JARDIANCE 25 MG PO TABS
25.0000 mg | ORAL_TABLET | Freq: Every day | ORAL | 3 refills | Status: DC
  Filled 2022-10-28: qty 30, 30d supply, fill #0
  Filled 2022-12-06: qty 30, 30d supply, fill #1
  Filled 2023-01-16: qty 30, 30d supply, fill #2
  Filled 2023-02-16: qty 30, 30d supply, fill #3

## 2022-11-01 ENCOUNTER — Other Ambulatory Visit: Payer: Self-pay

## 2022-11-08 ENCOUNTER — Other Ambulatory Visit: Payer: Self-pay

## 2022-11-15 ENCOUNTER — Other Ambulatory Visit: Payer: Self-pay

## 2022-11-15 MED ORDER — ATORVASTATIN CALCIUM 10 MG PO TABS
10.0000 mg | ORAL_TABLET | Freq: Every day | ORAL | 3 refills | Status: DC
  Filled 2022-11-15: qty 90, 90d supply, fill #0
  Filled 2023-02-16: qty 90, 90d supply, fill #1

## 2022-11-18 ENCOUNTER — Other Ambulatory Visit: Payer: Self-pay

## 2022-12-05 ENCOUNTER — Other Ambulatory Visit: Payer: Self-pay

## 2022-12-05 MED ORDER — OZEMPIC (1 MG/DOSE) 4 MG/3ML ~~LOC~~ SOPN
1.0000 mg | PEN_INJECTOR | SUBCUTANEOUS | 1 refills | Status: DC
  Filled 2022-12-05 – 2023-01-09 (×2): qty 3, 28d supply, fill #0

## 2022-12-07 ENCOUNTER — Other Ambulatory Visit: Payer: Self-pay

## 2022-12-07 MED ORDER — AMLODIPINE BESYLATE 5 MG PO TABS
5.0000 mg | ORAL_TABLET | Freq: Every day | ORAL | 3 refills | Status: AC
  Filled 2022-12-07: qty 90, 90d supply, fill #0

## 2022-12-08 ENCOUNTER — Encounter: Payer: Self-pay | Admitting: Pharmacist

## 2022-12-19 ENCOUNTER — Other Ambulatory Visit: Payer: Self-pay

## 2023-01-02 ENCOUNTER — Encounter: Payer: Self-pay | Admitting: Oncology

## 2023-01-02 ENCOUNTER — Other Ambulatory Visit: Payer: Self-pay

## 2023-01-02 MED ORDER — JARDIANCE 25 MG PO TABS
25.0000 mg | ORAL_TABLET | Freq: Every day | ORAL | 3 refills | Status: DC
  Filled 2023-01-02: qty 30, 30d supply, fill #0
  Filled 2023-01-09: qty 90, 90d supply, fill #0

## 2023-01-09 ENCOUNTER — Other Ambulatory Visit: Payer: Self-pay

## 2023-01-16 ENCOUNTER — Other Ambulatory Visit: Payer: Self-pay

## 2023-01-17 ENCOUNTER — Other Ambulatory Visit: Payer: Self-pay | Admitting: Internal Medicine

## 2023-01-17 DIAGNOSIS — Z1231 Encounter for screening mammogram for malignant neoplasm of breast: Secondary | ICD-10-CM

## 2023-02-06 ENCOUNTER — Other Ambulatory Visit: Payer: Self-pay

## 2023-02-06 MED ORDER — OZEMPIC (2 MG/DOSE) 8 MG/3ML ~~LOC~~ SOPN
2.0000 mg | PEN_INJECTOR | SUBCUTANEOUS | 1 refills | Status: DC
  Filled 2023-02-06: qty 3, 28d supply, fill #0

## 2023-02-13 ENCOUNTER — Other Ambulatory Visit: Payer: Self-pay

## 2023-02-17 ENCOUNTER — Ambulatory Visit
Admission: RE | Admit: 2023-02-17 | Discharge: 2023-02-17 | Disposition: A | Discharge status: Home / Self Care | Payer: No Typology Code available for payment source | Source: Ambulatory Visit | Attending: Internal Medicine | Admitting: Internal Medicine

## 2023-02-17 DIAGNOSIS — Z1231 Encounter for screening mammogram for malignant neoplasm of breast: Secondary | ICD-10-CM | POA: Diagnosis present

## 2023-02-21 ENCOUNTER — Other Ambulatory Visit: Payer: Self-pay

## 2023-02-21 MED ORDER — AMOXICILLIN-POT CLAVULANATE 875-125 MG PO TABS
1.0000 | ORAL_TABLET | Freq: Two times a day (BID) | ORAL | 0 refills | Status: DC
  Filled 2023-02-21: qty 14, 7d supply, fill #0

## 2023-02-22 ENCOUNTER — Other Ambulatory Visit: Payer: Self-pay

## 2023-02-22 ENCOUNTER — Other Ambulatory Visit (HOSPITAL_BASED_OUTPATIENT_CLINIC_OR_DEPARTMENT_OTHER): Payer: Self-pay

## 2023-02-22 ENCOUNTER — Encounter: Payer: Self-pay | Admitting: Oncology

## 2023-02-22 ENCOUNTER — Encounter (HOSPITAL_BASED_OUTPATIENT_CLINIC_OR_DEPARTMENT_OTHER): Payer: Self-pay

## 2023-02-22 ENCOUNTER — Emergency Department (HOSPITAL_BASED_OUTPATIENT_CLINIC_OR_DEPARTMENT_OTHER): Payer: No Typology Code available for payment source | Admitting: Radiology

## 2023-02-22 ENCOUNTER — Observation Stay (HOSPITAL_BASED_OUTPATIENT_CLINIC_OR_DEPARTMENT_OTHER)
Admission: EM | Admit: 2023-02-22 | Discharge: 2023-02-23 | Disposition: A | Discharge status: Home / Self Care | Payer: No Typology Code available for payment source | Attending: Internal Medicine | Admitting: Internal Medicine

## 2023-02-22 DIAGNOSIS — F17210 Nicotine dependence, cigarettes, uncomplicated: Secondary | ICD-10-CM | POA: Insufficient documentation

## 2023-02-22 DIAGNOSIS — Y792 Prosthetic and other implants, materials and accessory orthopedic devices associated with adverse incidents: Secondary | ICD-10-CM | POA: Insufficient documentation

## 2023-02-22 DIAGNOSIS — E1169 Type 2 diabetes mellitus with other specified complication: Secondary | ICD-10-CM | POA: Diagnosis present

## 2023-02-22 DIAGNOSIS — E119 Type 2 diabetes mellitus without complications: Secondary | ICD-10-CM

## 2023-02-22 DIAGNOSIS — Z7984 Long term (current) use of oral hypoglycemic drugs: Secondary | ICD-10-CM | POA: Insufficient documentation

## 2023-02-22 DIAGNOSIS — E785 Hyperlipidemia, unspecified: Secondary | ICD-10-CM | POA: Diagnosis not present

## 2023-02-22 DIAGNOSIS — Z79899 Other long term (current) drug therapy: Secondary | ICD-10-CM | POA: Insufficient documentation

## 2023-02-22 DIAGNOSIS — E1159 Type 2 diabetes mellitus with other circulatory complications: Secondary | ICD-10-CM | POA: Diagnosis present

## 2023-02-22 DIAGNOSIS — L03116 Cellulitis of left lower limb: Secondary | ICD-10-CM | POA: Diagnosis not present

## 2023-02-22 DIAGNOSIS — I10 Essential (primary) hypertension: Secondary | ICD-10-CM | POA: Insufficient documentation

## 2023-02-22 DIAGNOSIS — I152 Hypertension secondary to endocrine disorders: Secondary | ICD-10-CM | POA: Diagnosis present

## 2023-02-22 DIAGNOSIS — F418 Other specified anxiety disorders: Secondary | ICD-10-CM | POA: Diagnosis present

## 2023-02-22 DIAGNOSIS — W5541XA Bitten by pig, initial encounter: Secondary | ICD-10-CM | POA: Diagnosis present

## 2023-02-22 DIAGNOSIS — L089 Local infection of the skin and subcutaneous tissue, unspecified: Secondary | ICD-10-CM | POA: Diagnosis present

## 2023-02-22 HISTORY — DX: Type 2 diabetes mellitus without complications: E11.9

## 2023-02-22 LAB — BASIC METABOLIC PANEL
Anion gap: 9 (ref 5–15)
BUN: 9 mg/dL (ref 6–20)
CO2: 26 mmol/L (ref 22–32)
Calcium: 9.2 mg/dL (ref 8.9–10.3)
Chloride: 101 mmol/L (ref 98–111)
Creatinine, Ser: 0.51 mg/dL (ref 0.44–1.00)
GFR, Estimated: >60 mL/min (ref >60)
Glucose, Bld: 202 mg/dL — ABNORMAL HIGH (ref 70–99)
Potassium: 3.8 mmol/L (ref 3.5–5.1)
Sodium: 136 mmol/L (ref 135–145)

## 2023-02-22 LAB — CBC WITH DIFFERENTIAL/PLATELET
Abs Immature Granulocytes: 0.02 10*3/uL (ref 0.00–0.07)
Basophils Absolute: 0 10*3/uL (ref 0.0–0.1)
Basophils Relative: 1 %
Eosinophils Absolute: 0.1 10*3/uL (ref 0.0–0.5)
Eosinophils Relative: 1 %
HCT: 43.7 % (ref 36.0–46.0)
Hemoglobin: 14.7 g/dL (ref 12.0–15.0)
Immature Granulocytes: 0 %
Lymphocytes Relative: 27 %
Lymphs Abs: 2 10*3/uL (ref 0.7–4.0)
MCH: 31.2 pg (ref 26.0–34.0)
MCHC: 33.6 g/dL (ref 30.0–36.0)
MCV: 92.8 fL (ref 80.0–100.0)
Monocytes Absolute: 0.6 10*3/uL (ref 0.1–1.0)
Monocytes Relative: 8 %
Neutro Abs: 4.6 10*3/uL (ref 1.7–7.7)
Neutrophils Relative %: 63 %
Platelets: 183 10*3/uL (ref 150–400)
RBC: 4.71 MIL/uL (ref 3.87–5.11)
RDW: 12.3 % (ref 11.5–15.5)
WBC: 7.3 10*3/uL (ref 4.0–10.5)
nRBC: 0 % (ref 0.0–0.2)

## 2023-02-22 LAB — LACTIC ACID, PLASMA
Lactic Acid, Venous: 2 mmol/L (ref 0.5–1.9)
Lactic Acid, Venous: 1.9 mmol/L (ref 0.5–1.9)

## 2023-02-22 LAB — CBG MONITORING, ED: Glucose-Capillary: 186 mg/dL — ABNORMAL HIGH (ref 70–99)

## 2023-02-22 LAB — GLUCOSE, CAPILLARY: Glucose-Capillary: 187 mg/dL — ABNORMAL HIGH (ref 70–99)

## 2023-02-22 MED ORDER — OMEPRAZOLE 20 MG PO CPDR
20.0000 mg | DELAYED_RELEASE_CAPSULE | Freq: Every day | ORAL | Status: DC
  Administered 2023-02-23: 20 mg via ORAL
  Filled 2023-02-22: qty 1

## 2023-02-22 MED ORDER — VANCOMYCIN HCL IN DEXTROSE 1-5 GM/200ML-% IV SOLN
1000.0000 mg | Freq: Once | INTRAVENOUS | Status: AC
  Administered 2023-02-22: 1000 mg via INTRAVENOUS
  Filled 2023-02-22: qty 200

## 2023-02-22 MED ORDER — INSULIN ASPART 100 UNIT/ML IJ SOLN
0.0000 [IU] | Freq: Three times a day (TID) | INTRAMUSCULAR | Status: DC
  Administered 2023-02-23 (×2): 2 [IU] via SUBCUTANEOUS

## 2023-02-22 MED ORDER — EMPAGLIFLOZIN 25 MG PO TABS
25.0000 mg | ORAL_TABLET | Freq: Every day | ORAL | Status: DC
  Administered 2023-02-23: 25 mg via ORAL
  Filled 2023-02-22: qty 1

## 2023-02-22 MED ORDER — ATORVASTATIN CALCIUM 10 MG PO TABS
10.0000 mg | ORAL_TABLET | Freq: Every day | ORAL | Status: DC
  Administered 2023-02-23: 10 mg via ORAL
  Filled 2023-02-22: qty 1

## 2023-02-22 MED ORDER — SODIUM CHLORIDE 0.9 % IV BOLUS
1000.0000 mL | Freq: Once | INTRAVENOUS | Status: AC
  Administered 2023-02-22: 1000 mL via INTRAVENOUS

## 2023-02-22 MED ORDER — HYDROCODONE-ACETAMINOPHEN 5-325 MG PO TABS
1.0000 | ORAL_TABLET | ORAL | Status: DC | PRN
  Administered 2023-02-22: 1 via ORAL
  Filled 2023-02-22: qty 1

## 2023-02-22 MED ORDER — ACETAMINOPHEN 650 MG RE SUPP: 650.0000 mg | Freq: Four times a day (QID) | RECTAL | Status: DC | PRN

## 2023-02-22 MED ORDER — ONDANSETRON HCL 4 MG/2ML IJ SOLN: 4.0000 mg | Freq: Four times a day (QID) | INTRAMUSCULAR | Status: DC | PRN

## 2023-02-22 MED ORDER — ACETAMINOPHEN 325 MG PO TABS: 650.0000 mg | ORAL_TABLET | Freq: Four times a day (QID) | ORAL | Status: DC | PRN

## 2023-02-22 MED ORDER — AMLODIPINE BESYLATE 5 MG PO TABS
5.0000 mg | ORAL_TABLET | Freq: Every day | ORAL | Status: DC
  Administered 2023-02-23: 5 mg via ORAL
  Filled 2023-02-22: qty 1

## 2023-02-22 MED ORDER — MORPHINE SULFATE (PF) 2 MG/ML IV SOLN: 1.0000 mg | INTRAVENOUS | Status: DC | PRN

## 2023-02-22 MED ORDER — VANCOMYCIN HCL 1750 MG/350ML IV SOLN
1750.0000 mg | INTRAVENOUS | Status: DC
  Filled 2023-02-22: qty 350

## 2023-02-22 MED ORDER — VENLAFAXINE HCL ER 150 MG PO CP24
150.0000 mg | ORAL_CAPSULE | Freq: Every day | ORAL | Status: DC
  Administered 2023-02-23: 150 mg via ORAL
  Filled 2023-02-22: qty 1

## 2023-02-22 MED ORDER — SODIUM CHLORIDE 0.9 % IV SOLN
2.0000 g | Freq: Once | INTRAVENOUS | Status: AC
  Administered 2023-02-22: 2 g via INTRAVENOUS
  Filled 2023-02-22: qty 12.5

## 2023-02-22 MED ORDER — ONDANSETRON HCL 4 MG PO TABS: 4.0000 mg | ORAL_TABLET | Freq: Four times a day (QID) | ORAL | Status: DC | PRN

## 2023-02-22 MED ORDER — LOSARTAN POTASSIUM 50 MG PO TABS
50.0000 mg | ORAL_TABLET | Freq: Every day | ORAL | Status: DC
  Administered 2023-02-23: 50 mg via ORAL
  Filled 2023-02-22: qty 1

## 2023-02-22 MED ORDER — ENOXAPARIN SODIUM 40 MG/0.4ML IJ SOSY
40.0000 mg | PREFILLED_SYRINGE | INTRAMUSCULAR | Status: DC
  Administered 2023-02-22: 40 mg via SUBCUTANEOUS
  Filled 2023-02-22: qty 0.4

## 2023-02-22 MED ORDER — VANCOMYCIN HCL 1250 MG/250ML IV SOLN
1250.0000 mg | INTRAVENOUS | Status: DC
  Administered 2023-02-23: 1250 mg via INTRAVENOUS
  Filled 2023-02-22: qty 250

## 2023-02-22 MED ORDER — SODIUM CHLORIDE 0.9 % IV SOLN
3.0000 g | Freq: Four times a day (QID) | INTRAVENOUS | Status: DC
  Administered 2023-02-22 – 2023-02-23 (×4): 3 g via INTRAVENOUS
  Filled 2023-02-22 (×4): qty 8

## 2023-02-22 MED ORDER — SENNOSIDES-DOCUSATE SODIUM 8.6-50 MG PO TABS: 1.0000 | ORAL_TABLET | Freq: Every evening | ORAL | Status: DC | PRN

## 2023-02-22 NOTE — Progress Notes (Signed)
Plan of Care Note for accepted transfer   Patient: Nigel Yong MRN: [id number]   DOA: 02/22/2023  Facility requesting transfer: [facility] ED Requesting Provider: Elayne Snare, DO Reason for transfer: Left lower extremity cellulitis secondary to animal bite Facility course:   Ghaida Erbes is a 52 year old female with past medical history significant for type 2 diabetes mellitus, HTN, HLD, anxiety/depression, GERD who presented to [facility] on 6/19 with progressive swelling, drainage, erythema to left leg.  Apparently, patient was "bit" by her pet pig on Monday.  She was seen by Deboraha Sprang PCP yesterday and given an IM shot of Rocephin, Toradol and a prescription for Augmentin.  Despite treatment symptoms continue to progress.  Patient was afebrile without leukocytosis.  Lactic acid elevated 2.0.  Left tibia/fibula x-ray with no evidence of fracture or focal bone lesion, soft tissues are unremarkable.  Patient was started on IV vancomycin, cefepime and given 1 L NS bolus.  EDP requested transfer to [facility] for further evaluation and management of cellulitis.   Plan of care: The patient is accepted for admission to Med-surg  unit, at [facility]..  Discussed with ED physician, consideration for CT lower extremity to rule out deeper infection  Author: Alvira Philips [place], DO 02/22/2023  Check [email or web address] for on-call coverage.  Nursing staff, Please call [facility] Admits & Consults System-Wide number on Amion as soon as patient's arrival, so appropriate admitting provider can evaluate the pt.

## 2023-02-22 NOTE — ED Notes (Signed)
Ambulatory to restroom

## 2023-02-22 NOTE — ED Triage Notes (Signed)
In for further evaluation of pig bite to left calf 2 day prior. Was seen yesterday at [facility] and received Rocephin 500 mg IM and Toradol 30 mg IM. Given Rx for Augmentin that she started this am. Left calf is tender, swollen, draining, and erythematous.

## 2023-02-22 NOTE — H&P (Signed)
History and Physical    Helen Martinez UJW:[id number] DOB: 1971/02/20 DOA: 02/22/2023  PCP: Collene Mares, PA  Patient coming from: Home  I have personally briefly reviewed patient's old medical records in [facility]  Chief Complaint: Pig bite left leg  HPI: Helen Martinez is a 52 y.o. female with medical history significant for T2DM, HTN, HLD, depression/anxiety who presented to the ED for evaluation of left leg wound infection after a pig bite.  Patient reports that she was bitten by her pet pig on her left calf 2 days ago.  She was seen in [facility] yesterday and given a shot of IM ceftriaxone and a prescription for Augmentin.  This morning she had increasing pain and erythema at the bite wound site on her left calf.  She has been seeing clear red drainage from the wound but no purulent discharge.  She denies fevers, chills, diaphoresis.  On transfer to the floor, patient reports pain does seem to be improving after initial IV antibiotics were administered.  [facility] ED Course  Labs/Imaging on admission: I have personally reviewed following labs and imaging studies.  Initial vitals showed BP 172/96, pulse 99, RR 16, temp 90.2 F, SpO2 99% on room air.  Labs show WBC 7.3, hemoglobin 14.7, platelets 183,000, sodium 136, potassium 3.8, bicarb 26, BUN 9, creatinine 0.51, serum glucose 202, lactic acid 2.0 > 1.9.  Blood cultures in process.  Left tibia/fibula x-ray negative for evidence of fracture or focal bone lesions.  Patient was given 1 L normal saline, IV vancomycin and cefepime.  The hospitalist service was consulted to admit for further evaluation and management.  Review of Systems: All systems reviewed and are negative except as documented in history of present illness above.   Past Medical History:  Diagnosis Date   Depression    Diabetes mellitus without complication (HCC)    GERD (gastroesophageal reflux disease)    Hypertension    Porphyria  cutanea tarda (HCC)     Past Surgical History:  Procedure Laterality Date   ABDOMINAL HYSTERECTOMY     BREAST BIOPSY Right 06/14/2017   Benign    Social History:  reports that she has been smoking cigarettes. She has been smoking an average of 1 pack per day. She has never used smokeless tobacco. She reports current alcohol use. She reports that she does not use drugs.  Allergies  Allergen Reactions   Bacitracin Itching    Topical ointment mild itching   Lisinopril Cough    Family History  Problem Relation Age of Onset   Hypertension Mother    Cancer Father        mouth then mets   Breast cancer Maternal Aunt 54     Prior to Admission medications   Medication Sig Start Date End Date Taking? Authorizing Provider  amLODipine (NORVASC) 5 MG tablet Take 1 tablet (5 mg total) by mouth daily. 12/07/22  Yes   amoxicillin-clavulanate (AUGMENTIN) 875-125 MG tablet Take 1 tablet by mouth every 12 (twelve) hours for 7 days. 02/21/23  Yes   atorvastatin (LIPITOR) 10 MG tablet Take 1 tablet (10 mg total) by mouth daily. 11/15/22  Yes   empagliflozin (JARDIANCE) 25 MG TABS tablet Take 1 tablet (25 mg total) by mouth daily. 01/02/23  Yes   losartan (COZAAR) 50 MG tablet Take 1 tablet (50 mg total) by mouth daily. 05/30/22  Yes   metFORMIN (GLUCOPHAGE-XR) 500 MG 24 hr tablet Take 1 tablet by mouth with evening meal for  2 weeks. then 1 tablet twice a day with food Orally 90 days 01/25/21  Yes   omeprazole (PRILOSEC OTC) 20 MG tablet Take 20 mg by mouth daily.   Yes [provider]  venlafaxine XR (EFFEXOR-XR) 150 MG 24 hr capsule Take 1 capsule by mouth with food Once a day 90 days 03/04/22  Yes   albuterol (PROVENTIL HFA;VENTOLIN HFA) 108 (90 Base) MCG/ACT inhaler Inhale 2 puffs into the lungs every 4 (four) hours as needed. 09/13/18   Janalyn Harder, PA-C  buPROPion (WELLBUTRIN SR) 150 MG 12 hr tablet Take 1 tablet (150 mg total) by mouth daily. 09/02/22     colchicine 0.6 MG tablet On the  first day take 2 tablets by mouth at a time, followed by 1 tablet one hour later. Then take twice daily until flare resolves 06/23/21     doxycycline (VIBRA-TABS) 100 MG tablet Take 1 tablet (100 mg total) by mouth 2 (two) times daily. 11/22/21     meloxicam (MOBIC) 15 MG tablet Take 1 tablet (15 mg total) by mouth daily. 08/16/22     promethazine (PHENERGAN) 6.25 MG/5ML syrup Take 10 mLs (12.5 mg total) by mouth every 6 (six) hours as needed. 10/22/21     triamcinolone lotion (KENALOG) 0.1 % Apply Externally Twice a day 14 days 12/31/20       Physical Exam: Vitals:   02/22/23 1415 02/22/23 1527 02/22/23 1609 02/22/23 1829  BP: (!) 150/88  (!) 144/91 (!) 155/101  Pulse: 74  82 77  Resp: 18  18 17   Temp:  98.3 F (36.8 C)  98.2 F (36.8 C)  TempSrc:  Oral  Oral  SpO2: 100%  95% 99%  Weight:      Height:       Constitutional: Resting in bed, NAD, calm, comfortable Eyes: EOMI, lids and conjunctivae normal ENMT: Mucous membranes are moist. Posterior pharynx clear of any exudate or lesions.Normal dentition.  Neck: normal, supple, no masses. Respiratory: clear to auscultation bilaterally, no wheezing, no crackles. Normal respiratory effort. No accessory muscle use.  Cardiovascular: Regular rate and rhythm, no murmurs / rubs / gallops. No extremity edema. 2+ pedal pulses. Abdomen: no tenderness, no masses palpated.  Musculoskeletal: no clubbing / cyanosis. No joint deformity upper and lower extremities. Good ROM, no contractures. Normal muscle tone.  Skin: Bite wound left calf with mild surrounding erythema.  No active discharge or significant tenderness with palpation around wound site Neurologic: Sensation intact. Strength 5/5 in all 4.  Psychiatric: Normal judgment and insight. Alert and oriented x 3. Normal mood.    EKG: Not performed.  Assessment/Plan Principal Problem:   Cellulitis of left lower extremity Active Problems:   Hypertension associated with diabetes (HCC)   Bitten by  pig   Type 2 diabetes mellitus (HCC)   Hyperlipidemia associated with type 2 diabetes mellitus (HCC)   Depression with anxiety   Helen Martinez is a 52 y.o. female with medical history significant for T2DM, HTN, HLD, depression/anxiety who is admitted with cellulitis of the left lower extremity after a pig bite.  Assessment and Plan: Cellulitis of left lower extremity after pig bite: S/p pig bite 6/17 to left calf.  She was given IM ceftriaxone and started on oral Augmentin day prior admission.  Presenting with worsening erythema and pain.  She is afebrile and otherwise hemodynamically stable. -Continue IV vancomycin and Unasyn -Consult to wound care  Type 2 diabetes: Hold metformin.  Continue Jardiance and SSI.  Hypertension: Continue amlodipine and losartan.  Hyperlipidemia: Continue atorvastatin.  Depression/anxiety: Continue Effexor XR.   DVT prophylaxis: enoxaparin (LOVENOX) injection 40 mg Start: 02/22/23 2200 Code Status: Full code, confirmed with patient on admission Family Communication: Discussed with patient, she has discussed with family Disposition Plan: From home and likely discharge to home pending clinical progress Consults called: None Severity of Illness: The appropriate patient status for this patient is OBSERVATION. Observation status is judged to be reasonable and necessary in order to provide the required intensity of service to ensure the patient's safety. The patient's presenting symptoms, physical exam findings, and initial radiographic and laboratory data in the context of their medical condition is felt to place them at decreased risk for further clinical deterioration. Furthermore, it is anticipated that the patient will be medically stable for discharge from the hospital within 2 midnights of admission.   Darreld Mclean MD [facility]  If 7PM-7AM, please contact night-coverage [email or web address]  02/22/2023, 7:45 PM

## 2023-02-22 NOTE — Hospital Course (Signed)
Helen Martinez is a 52 y.o. female with medical history significant for T2DM, HTN, HLD, depression/anxiety who is admitted with cellulitis of the left lower extremity after a pig bite.

## 2023-02-22 NOTE — ED Provider Notes (Signed)
[place] EMERGENCY DEPARTMENT AT [facility] Provider Note   CSN: [id number] Arrival date & time: 02/22/23  1045     History  Chief Complaint  Patient presents with   Wound Infection    Helen Martinez is a 52 y.o. female with a PMHx of HTN, DM, who presents to the ED with concerns for wound infection. She notes that she was bit by a pig on 02/20/23. She was evaluated by her PCP yesterday and given Rocephin IM in office. Was sent on Augmentin which she started today. Presenting today due to persistent pain and redness spreading. Has had drainage. Denies fever.    Per pt chart review: Pt was evaluated by her PCP at [facility] on yesterday and given IM rocephin, Rx for Augmentin, and toradol IM.   The history is provided by the patient. No language interpreter was used.       Home Medications Prior to Admission medications   Medication Sig Start Date End Date Taking? Authorizing Provider  amLODipine (NORVASC) 5 MG tablet Take 1 tablet (5 mg total) by mouth daily. 12/07/22  Yes   amoxicillin-clavulanate (AUGMENTIN) 875-125 MG tablet Take 1 tablet by mouth every 12 (twelve) hours for 7 days. 02/21/23  Yes   atorvastatin (LIPITOR) 10 MG tablet Take 1 tablet (10 mg total) by mouth daily. 11/15/22  Yes   empagliflozin (JARDIANCE) 25 MG TABS tablet Take 1 tablet (25 mg total) by mouth daily. 01/02/23  Yes   losartan (COZAAR) 50 MG tablet Take 1 tablet (50 mg total) by mouth daily. 05/30/22  Yes   metFORMIN (GLUCOPHAGE-XR) 500 MG 24 hr tablet Take 1 tablet by mouth with evening meal for 2 weeks. then 1 tablet twice a day with food Orally 90 days 01/25/21  Yes   omeprazole (PRILOSEC OTC) 20 MG tablet Take 20 mg by mouth daily.   Yes [provider]  venlafaxine XR (EFFEXOR-XR) 150 MG 24 hr capsule Take 1 capsule by mouth with food Once a day 90 days 03/04/22  Yes   albuterol (PROVENTIL HFA;VENTOLIN HFA) 108 (90 Base) MCG/ACT inhaler Inhale 2 puffs into the lungs every 4 (four) hours as  needed. 09/13/18   Janalyn Harder, PA-C  buPROPion (WELLBUTRIN SR) 150 MG 12 hr tablet Take 1 tablet (150 mg total) by mouth daily. 09/02/22     colchicine 0.6 MG tablet On the first day take 2 tablets by mouth at a time, followed by 1 tablet one hour later. Then take twice daily until flare resolves 06/23/21     doxycycline (VIBRA-TABS) 100 MG tablet Take 1 tablet (100 mg total) by mouth 2 (two) times daily. 11/22/21     meloxicam (MOBIC) 15 MG tablet Take 1 tablet (15 mg total) by mouth daily. 08/16/22     promethazine (PHENERGAN) 6.25 MG/5ML syrup Take 10 mLs (12.5 mg total) by mouth every 6 (six) hours as needed. 10/22/21     triamcinolone lotion (KENALOG) 0.1 % Apply Externally Twice a day 14 days 12/31/20         Allergies    Bacitracin and Lisinopril    Review of Systems   Review of Systems  All other systems reviewed and are negative.   Physical Exam Updated Vital Signs BP (!) 172/96 (BP Location: Right Arm)   Pulse 99   Temp 98.2 F (36.8 C) (Oral)   Resp 16   Ht 5\' 2"  (1.575 m)   Wt 80.3 kg   SpO2 99%   BMI 32.37 kg/m  Physical Exam Vitals and nursing note reviewed.  Constitutional:      General: She is not in acute distress.    Appearance: Normal appearance.  Eyes:     General: No scleral icterus.    Extraocular Movements: Extraocular movements intact.  Cardiovascular:     Rate and Rhythm: Normal rate.  Pulmonary:     Effort: Pulmonary effort is normal. No respiratory distress.  Abdominal:     Palpations: Abdomen is soft. There is no mass.     Tenderness: There is no abdominal tenderness.  Musculoskeletal:        General: Normal range of motion.     Cervical back: Neck supple.  Skin:    General: Skin is warm and dry.     Findings: No rash.     Comments: Erythema and increased warmth noted to right lower extremity. Swelling noted to RLE. Redness extending outside of marked area. Puncture noted mid posterior calf on right with some drainage noted to the area.  No purulence noted at this time. No appreciable fluctuance.   Neurological:     Mental Status: She is alert.     Sensory: Sensation is intact.     Motor: Motor function is intact.  Psychiatric:        Behavior: Behavior normal.     ED Results / Procedures / Treatments   Labs (all labs ordered are listed, but only abnormal results are displayed) Labs Reviewed  BASIC METABOLIC PANEL - Abnormal; Notable for the following components:      Result Value   Glucose, Bld 202 (*)    All other components within normal limits  LACTIC ACID, PLASMA - Abnormal; Notable for the following components:   Lactic Acid, Venous 2.0 (*)    All other components within normal limits  CBG MONITORING, ED - Abnormal; Notable for the following components:   Glucose-Capillary 186 (*)    All other components within normal limits  CULTURE, BLOOD (ROUTINE X 2)  CULTURE, BLOOD (ROUTINE X 2)  CBC WITH DIFFERENTIAL/PLATELET  LACTIC ACID, PLASMA    EKG None  Radiology DG Tibia/Fibula Left  Result Date: 02/22/2023 CLINICAL DATA:  Left calf pain and swelling after pig bite. EXAM: LEFT TIBIA AND FIBULA - 2 VIEW COMPARISON:  None Available. FINDINGS: There is no evidence of fracture or other focal bone lesions. Soft tissues are unremarkable. IMPRESSION: Negative. Electronically Signed   By: Lupita Raider M.D.   On: 02/22/2023 13:18    Procedures Procedures    Medications Ordered in ED Medications  vancomycin (VANCOCIN) IVPB 1000 mg/200 mL premix (0 mg Intravenous Stopped 02/22/23 1404)  ceFEPIme (MAXIPIME) 2 g in sodium chloride 0.9 % 100 mL IVPB (0 g Intravenous Stopped 02/22/23 1255)  sodium chloride 0.9 % bolus 1,000 mL (0 mLs Intravenous Stopped 02/22/23 1611)    ED Course/ Medical Decision Making/ A&P Clinical Course as of 02/22/23 1809  Wed Feb 22, 2023  1315 Lactic Acid, Venous(!!): 2.0 [SB]  1507 Pt re-evaluated and resting comfortably on stretcher. Discussed with patient lab and imaging findings.  Discussed with patient plan for admission. Pt agreeable at this time. Answered all available questions.  [SB]  1539 Consult with hospitalist, Dr. [place] who agrees with admission. Recommends CT of tib/fib if patient remains at [facility] for extended time waiting for transport to rule out abscess.  [SB]    Clinical Course User Index [SB] Namya Voges A, PA-C  Medical Decision Making Amount and/or Complexity of Data Reviewed Labs: ordered. Decision-making details documented in ED Course. Radiology: ordered.  Risk Prescription drug management. Decision regarding hospitalization.   Pt presents with concerns for pig bite on 02/20/23. Pt afebrile. On exam, patient with Erythema and increased warmth noted to right lower extremity. Redness extending outside of marked area. Puncture noted mid posterior calf on right with some drainage noted to the area. No purulence noted at this time. No appreciable fluctuance. Differential diagnosis includes cellulitis, foreign body, abscess.    Co morbidities that complicate the patient evaluation: DM, HTN   Labs:  I ordered, and personally interpreted labs.  The pertinent results include:   CBC at 186 Initial lactic at 2, repeat lactic at 1.9 Blood cultures ordered results pending at time of admission CBC unremarkable BMP with elevated glucose at 202 otherwise unremarkable  Imaging: I ordered imaging studies including left tib/fib xray I independently visualized and interpreted imaging which showed: No acute findings I agree with the radiologist interpretation  Medications:  I ordered medication including IVF, Vancomycin, and Cefepime for antibiotic treatment I have reviewed the patients home medicines and have made adjustments as needed   Consultations: I requested consultation with the Hospitalist, Dr. [place] and discussed lab and imaging findings as well as pertinent plan - they recommend: admission and also  recommends CT tib/fib if patient is in the ED for an extended period.   Disposition: Presenting suspicious for cellulitis.  Doubt concerns at this time for fracture or dislocation.  Doubt concerns at this time for foreign body.  After consideration of the diagnostic results and the patients response to treatment, I feel that the patient would benefit from Admission to the hospital. Discussed with patient plans for admission. Answered all available questions. Pt appears safe for admission at this time.    This chart was dictated using voice recognition software, Dragon. Despite the best efforts of this provider to proofread and correct errors, errors may still occur which can change documentation meaning.   Final Clinical Impression(s) / ED Diagnoses Final diagnoses:  Cellulitis of left lower extremity    Rx / DC Orders ED Discharge Orders     None         Layanna Charo A, PA-C 02/22/23 1809    Rexford Maus, DO 02/23/23 [phone]

## 2023-02-22 NOTE — Progress Notes (Signed)
Pharmacy Antibiotic Note  Helen Martinez is a 52 y.o. female admitted on 02/22/2023 with concerns for wound infection from a bite from her pet pig. She was seen outpatient and given IM Rocephin and then started on Augmentin. She had persistent pain and redness as well as drainage. Pharmacy has been consulted for vancomycin dosing. She was also started on Unasyn.  Plan: -Vancomycin 1 g IV given earlier; will follow with 1250 mg IV q24h -Unasyn 3 g IV q6h -Continue to follow renal function, cultures and clinical progress for dose adjustments and de-escalation as indicated  Height: 5\' 2"  (157.5 cm) Weight: 80.3 kg (177 lb) IBW/kg (Calculated) : 50.1  Temp (24hrs), Avg:98.2 F (36.8 C), Min:98.2 F (36.8 C), Max:98.3 F (36.8 C)  Recent Labs  Lab 02/22/23 1206 02/22/23 1357  WBC 7.3  --   CREATININE 0.51  --   LATICACIDVEN 2.0* 1.9    Estimated Creatinine Clearance: 78.9 mL/min (by C-G formula based on SCr of 0.51 mg/dL).    Allergies  Allergen Reactions   Bacitracin Itching    Topical ointment mild itching   Lisinopril Cough    Antimicrobials this admission: Vancomycin 6/19 >> Unasyn 6/19 >> Cefepime 6/19 x 1  Dose adjustments this admission: NA  Microbiology results: 6/19 BCx: pending   Thank you for allowing pharmacy to be a part of this patient's care.  Pricilla Riffle, PharmD, BCPS Clinical Pharmacist 02/22/2023 7:58 PM

## 2023-02-23 ENCOUNTER — Observation Stay (HOSPITAL_COMMUNITY): Payer: No Typology Code available for payment source

## 2023-02-23 ENCOUNTER — Encounter: Payer: Self-pay | Admitting: Oncology

## 2023-02-23 DIAGNOSIS — L03116 Cellulitis of left lower limb: Secondary | ICD-10-CM | POA: Diagnosis not present

## 2023-02-23 LAB — BASIC METABOLIC PANEL
Anion gap: 8 (ref 5–15)
BUN: 8 mg/dL (ref 6–20)
CO2: 27 mmol/L (ref 22–32)
Calcium: 9 mg/dL (ref 8.9–10.3)
Chloride: 100 mmol/L (ref 98–111)
Creatinine, Ser: 0.54 mg/dL (ref 0.44–1.00)
GFR, Estimated: >60 mL/min (ref >60)
Glucose, Bld: 163 mg/dL — ABNORMAL HIGH (ref 70–99)
Potassium: 3.8 mmol/L (ref 3.5–5.1)
Sodium: 135 mmol/L (ref 135–145)

## 2023-02-23 LAB — CBC
HCT: 46 % (ref 36.0–46.0)
Hemoglobin: 14.9 g/dL (ref 12.0–15.0)
MCH: 31 pg (ref 26.0–34.0)
MCHC: 32.4 g/dL (ref 30.0–36.0)
MCV: 95.6 fL (ref 80.0–100.0)
Platelets: 187 10*3/uL (ref 150–400)
RBC: 4.81 MIL/uL (ref 3.87–5.11)
RDW: 12 % (ref 11.5–15.5)
WBC: 6.4 10*3/uL (ref 4.0–10.5)
nRBC: 0 % (ref 0.0–0.2)

## 2023-02-23 LAB — CULTURE, BLOOD (ROUTINE X 2): Special Requests: ADEQUATE

## 2023-02-23 LAB — HIV ANTIBODY (ROUTINE TESTING W REFLEX): HIV Screen 4th Generation wRfx: NONREACTIVE

## 2023-02-23 LAB — GLUCOSE, CAPILLARY
Glucose-Capillary: 183 mg/dL — ABNORMAL HIGH (ref 70–99)
Glucose-Capillary: 158 mg/dL — ABNORMAL HIGH (ref 70–99)

## 2023-02-23 MED ORDER — SODIUM CHLORIDE (PF) 0.9 % IJ SOLN
INTRAMUSCULAR | Status: AC
  Filled 2023-02-23: qty 50

## 2023-02-23 MED ORDER — IOHEXOL 300 MG/ML  SOLN
100.0000 mL | Freq: Once | INTRAMUSCULAR | Status: AC | PRN
  Administered 2023-02-23: 100 mL via INTRAVENOUS

## 2023-02-23 NOTE — Consult Note (Signed)
WOC Nurse Consult Note: Reason for Consult:Animal bite to left posterior lower leg (pig).  Being treated with IV Vancomycin.  Wound type: animal bite, infectious Pressure Injury POA: NA Measurement: 6 cm puncture wound from animal bite,  Wound WUJ:WJXBJY to assess due inflammation and edema Drainage (amount, consistency, odor) minimal serosanguinous Periwound:Erythema, edema and tenderness.  Dressing procedure/placement/frequency: Cleanse puncture wound to left posterior lower leg with NS and pat dry. Apply thin strip of silver hydrofiber (aquacel) into wound bed.  [facility] # [id number]) .  Cover with dry gauze, kerlix and tape.  Change daily   Will not follow at this time.  Please re-consult if needed.  Mike Gip MSN, RN, FNP-BC CWON Wound, Ostomy, Continence Nurse Outpatient [facility] [phone] Pager [phone]

## 2023-02-23 NOTE — Discharge Summary (Signed)
Physician Discharge Summary  Helen Martinez ZOX:[id number] DOB: 1971/02/14 DOA: 02/22/2023  PCP: Collene Mares, PA  Admit date: 02/22/2023 Discharge date: 02/23/2023  Admitted From: home Disposition:  home  Recommendations for Outpatient Follow-up:  Follow up with PCP in 1-2 weeks  Home Health: none Equipment/Devices: none  Discharge Condition: stable CODE STATUS: Full code  HPI: Per admitting MD, Helen Martinez is a 52 y.o. female with medical history significant for T2DM, HTN, HLD, depression/anxiety who presented to the ED for evaluation of left leg wound infection after a pig bite. Patient reports that she was bitten by her pet pig on her left calf 2 days ago.  She was seen in [place] [facility] yesterday and given a shot of IM ceftriaxone and a prescription for Augmentin.  This morning she had increasing pain and erythema at the bite wound site on her left calf.  She has been seeing clear red drainage from the wound but no purulent discharge.  She denies fevers, chills, diaphoresis.  On transfer to the floor, patient reports pain does seem to be improving after initial IV antibiotics were administered.   Hospital Course / Discharge diagnoses: Principal Problem:   Cellulitis of left lower extremity Active Problems:   Hypertension associated with diabetes (HCC)   Bitten by pig   Type 2 diabetes mellitus (HCC)   Hyperlipidemia associated with type 2 diabetes mellitus (HCC)   Depression with anxiety  Principal problem Left lower extremity cellulitis following pig bite -patient was admitted to the hospital with significant swelling in the left lower extremity.  She had a bite by her pet pig couple days prior.  She went to her PCP, received a shot of ceftriaxone and oral Augmentin prescription.  She took a pill for Augmentin, but got concerned about an hour or 2 later that the swelling was getting worse and decided to go to the emergency room.  She was admitted and placed on IV  antibiotics.  She underwent a CT scan of the leg which showed cellulitis without abscess or any other acute osseous abnormalities.  Her wound and cellulitis have improved significantly on IV antibiotics.  Case was also briefly discussed with ID who recommends Augmentin as an oral option moving forward.  With improvement, she will be discharged home in stable condition, she is to continue her Augmentin that was prescribed for her.  This is unlikely failure of oral antibiotic given that she only took 1 dose couple hours prior to presenting to the ER.  She is afebrile and white count is normal.  She had borderline elevated lactic acid 2.0 which is normalized at 1.9.  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   amLODipine 5 MG tablet Commonly known as: NORVASC Take 1 tablet (5 mg total) by mouth daily.   amoxicillin-clavulanate 875-125 MG tablet Commonly known as: AUGMENTIN Take 1 tablet by mouth every 12 (twelve) hours for 7 days.   atorvastatin 10 MG tablet Commonly known as: LIPITOR Take 1 tablet (10  mg total) by mouth daily.   colchicine 0.6 MG tablet On the first day take 2 tablets by mouth at a time, followed by 1 tablet one hour later. Then take twice daily until flare resolves   Jardiance 25 MG Tabs tablet Generic drug: empagliflozin Take 1 tablet (25 mg total) by mouth daily.   losartan 50 MG tablet Commonly known as: COZAAR Take 1 tablet (50 mg total) by mouth daily.   meloxicam 15 MG  tablet Commonly known as: MOBIC Take 1 tablet (15 mg total) by mouth daily.   metFORMIN 500 MG 24 hr tablet Commonly known as: GLUCOPHAGE-XR Take 1 tablet by mouth with evening meal for 2 weeks. then 1 tablet twice a day with food Orally 90 days   omeprazole 20 MG tablet Commonly known as: PRILOSEC OTC Take 20 mg by mouth daily.   venlafaxine XR 150 MG 24 hr capsule Commonly known as: EFFEXOR-XR Take 1 capsule by mouth with food Once a day 90 days       Procedures/Studies:  CT TIBIA FIBULA LEFT W CONTRAST  Result Date: 02/23/2023 CLINICAL DATA:  Left lower leg cellulitis after a pig bite. EXAM: CT OF THE LOWER LEFT EXTREMITY WITH CONTRAST TECHNIQUE: Multidetector CT imaging of the lower left extremity was performed according to the standard protocol following intravenous contrast administration. RADIATION DOSE REDUCTION: This exam was performed according to the departmental dose-optimization program which includes automated exposure control, adjustment of the mA and/or kV according to patient size and/or use of iterative reconstruction technique. CONTRAST:  OMNIPAQUE IOHEXOL 300 MG/ML  SOLN COMPARISON:  Left tibia and fibula x-rays from yesterday. FINDINGS: Bones/Joint/Cartilage No bony destruction or periosteal reaction. No fracture or dislocation. Joint spaces are preserved. No joint effusion. Ligaments Ligaments are suboptimally evaluated by CT. Muscles and Tendons Grossly intact.  No muscle atrophy. Soft tissue Superficial soft tissue wound along the midline posterior upper calf with mild surrounding soft tissue swelling. No fluid collection or subcutaneous emphysema. No soft tissue mass. IMPRESSION: 1. Small superficial soft tissue wound along the midline posterior upper calf with mild surrounding cellulitis. No abscess. 2. No acute osseous abnormality. Electronically Signed   By: Obie Dredge M.D.   On: 02/23/2023 12:31   DG Tibia/Fibula Left  Result Date:  02/22/2023 CLINICAL DATA:  Left calf pain and swelling after pig bite. EXAM: LEFT TIBIA AND FIBULA - 2 VIEW COMPARISON:  None Available. FINDINGS: There is no evidence of fracture or other focal bone lesions. Soft tissues are unremarkable. IMPRESSION: Negative. Electronically Signed   By: Lupita Raider M.D.   On: 02/22/2023 13:18   MM 3D SCREENING MAMMOGRAM BILATERAL BREAST  Result Date: 02/21/2023 CLINICAL DATA:  Screening. EXAM: DIGITAL SCREENING BILATERAL MAMMOGRAM WITH TOMOSYNTHESIS AND CAD TECHNIQUE: Bilateral screening digital craniocaudal and mediolateral oblique mammograms were obtained. Bilateral screening digital breast tomosynthesis was performed. The images were evaluated with computer-aided detection. COMPARISON:  Previous exam(s). ACR Breast Density Category c: The breasts are heterogeneously dense, which may obscure small masses. FINDINGS: There are no findings suspicious for malignancy. IMPRESSION: No mammographic evidence of malignancy. A result letter of this screening mammogram will be mailed directly to the patient. RECOMMENDATION: Screening mammogram in one year. (Code:SM-B-01Y) BI-RADS CATEGORY  1: Negative. Electronically Signed   By: Harmon Pier M.D.   On: 02/21/2023 10:30    Subjective: - no chest pain, shortness of breath, no abdominal pain, nausea or vomiting.   Discharge Exam: BP (!) 154/101 (BP Location: Left Arm)  Pulse 69   Temp 98.4 F (36.9 C) (Oral)   Resp 16   Ht 5\' 2"  (1.575 m)   Wt 80.3 kg   SpO2 97%   BMI 32.37 kg/m   General: Pt is alert, awake, not in acute distress Cardiovascular: RRR, S1/S2 +, no rubs, no gallops Respiratory: CTA bilaterally, no wheezing, no rhonchi Abdominal: Soft, NT, ND, bowel sounds + Extremities: no edema, no cyanosis   The results of significant diagnostics from this hospitalization (including imaging, microbiology, ancillary and laboratory) are listed below for reference.     Microbiology: Recent Results (from the  past 240 hour(s))  Culture, blood (routine x 2)     Status: None (Preliminary result)   Collection Time: 02/22/23 12:06 PM   Specimen: BLOOD  Result Value Ref Range Status   Specimen Description   Final    BLOOD LEFT ANTECUBITAL Performed at [facility], [address], [place], [place] [address]    Special Requests   Final    Blood Culture adequate volume BOTTLES DRAWN AEROBIC AND ANAEROBIC Performed at [facility], [address], [place], [place] [address]    Culture   Final    NO GROWTH < 24 HOURS Performed at [facility], [address] [place], [place] [address]    Report Status PENDING  Incomplete  Culture, blood (routine x 2)     Status: None (Preliminary result)   Collection Time: 02/22/23 12:06 PM   Specimen: BLOOD  Result Value Ref Range Status   Specimen Description   Final    BLOOD BLOOD RIGHT FOREARM Performed at [facility], [address], [place], [place] [address]    Special Requests   Final    Blood Culture adequate volume BOTTLES DRAWN AEROBIC AND ANAEROBIC Performed at [facility], [address], [place], [place] [address]    Culture   Final    NO GROWTH < 24 HOURS Performed at [facility], [address], [place], [place] [address]    Report Status PENDING  Incomplete     Labs: Basic Metabolic Panel: Recent Labs  Lab 02/22/23 1206 02/23/23 0441  NA 136 135  K 3.8 3.8  CL 101 100  CO2 26 27  GLUCOSE 202* 163*  BUN 9 8  CREATININE 0.51 0.54  CALCIUM 9.2 9.0   Liver Function Tests: No results for input(s): "AST", "ALT", "ALKPHOS", "BILITOT", "PROT", "ALBUMIN" in the last 168 hours. CBC: Recent Labs  Lab 02/22/23 1206 02/23/23 0441  WBC 7.3 6.4  NEUTROABS 4.6  --   HGB 14.7 14.9  HCT 43.7 46.0  MCV 92.8 95.6  PLT 183 187   CBG: Recent Labs  Lab 02/22/23 1526 02/22/23 2249 02/23/23 0745 02/23/23 1151  GLUCAP 186* 187* 183* 158*    Hgb A1c No results for input(s): "HGBA1C" in the last 72 hours. Lipid Profile No results for input(s): "CHOL", "HDL", "LDLCALC", "TRIG", "CHOLHDL", "LDLDIRECT" in the last 72 hours. Thyroid function studies No results for input(s): "TSH", "T4TOTAL", "T3FREE", "THYROIDAB" in the last 72 hours.  Invalid input(s): "FREET3" Urinalysis    Component Value Date/Time   COLORURINE YELLOW (A) 12/02/2019 2309   APPEARANCEUR HAZY (A) 12/02/2019 2309   LABSPEC 1.010 12/02/2019 2309   PHURINE 5.0 12/02/2019 2309   GLUCOSEU NEGATIVE 12/02/2019 2309   HGBUR NEGATIVE 12/02/2019 2309   BILIRUBINUR NEGATIVE 12/02/2019 2309   KETONESUR NEGATIVE 12/02/2019 2309   PROTEINUR NEGATIVE 12/02/2019 2309   NITRITE NEGATIVE 12/02/2019  2309   LEUKOCYTESUR NEGATIVE 12/02/2019 2309    FURTHER DISCHARGE INSTRUCTIONS:   Get Medicines reviewed and adjusted: Please take all your medications with you for your next visit with your Primary MD   Laboratory/radiological data: Please request your Primary MD to go over all hospital tests and procedure/radiological results at the follow up, please ask your Primary MD to get all Hospital records sent to his/her office.   In some cases, they will be blood work, cultures and biopsy results pending at the time of your discharge. Please request that your primary care M.D. goes through all the records of your hospital data and follows up on these results.   Also Note the following: If you experience worsening of your admission symptoms, develop shortness of breath, life threatening emergency, suicidal or homicidal thoughts you must seek medical attention immediately by calling 911 or calling your MD immediately  if symptoms less severe.   You must read complete instructions/literature along with all the possible adverse reactions/side effects for all the Medicines you take and that have been prescribed to you. Take any new Medicines after you have completely understood and  accpet all the possible adverse reactions/side effects.    Do not drive when taking Pain medications or sleeping medications (Benzodaizepines)   Do not take more than prescribed Pain, Sleep and Anxiety Medications. It is not advisable to combine anxiety,sleep and pain medications without talking with your primary care practitioner   Special Instructions: If you have smoked or chewed Tobacco  in the last 2 yrs please stop smoking, stop any regular Alcohol  and or any Recreational drug use.   Wear Seat belts while driving.   Please note: You were cared for by a hospitalist during your hospital stay. Once you are discharged, your primary care physician will handle any further medical issues. Please note that NO REFILLS for any discharge medications will be authorized once you are discharged, as it is imperative that you return to your primary care physician (or establish a relationship with a primary care physician if you do not have one) for your post hospital discharge needs so that they can reassess your need for medications and monitor your lab values.  Time coordinating discharge: 35 minutes  SIGNED:  Pamella Pert, MD, PhD 02/23/2023, 1:08 PM

## 2023-02-23 NOTE — Progress Notes (Signed)
Reviewed d/c instructions with pt. All questions answered. Pt taken to front entrance to meet ride in stable condition.

## 2023-02-23 NOTE — TOC CM/SW Note (Signed)
Transition of Care [facility]) - Inpatient Brief Assessment   Patient Details  Name: Margarette Anis MRN: [id number] Date of Birth: 1971/07/11  Transition of Care (TOC) CM/SW Contact:    Armanda Heritage, RN Phone Number: 02/23/2023, 1:23 PM   Clinical Narrative:  Patient from home, independent at baseline.  Noted patient will discharge home today.  No TOC needs identified at this time.  Transition of Care Asessment: Insurance and Status: Insurance coverage has been reviewed Patient has primary care physician: Yes Home environment has been reviewed: from home Prior level of function:: independent Prior/Current Home Services: No current home services Social Determinants of Health Reivew: SDOH reviewed no interventions necessary Readmission risk has been reviewed: Yes Transition of care needs: no transition of care needs at this time

## 2023-02-23 NOTE — Discharge Instructions (Signed)
Follow with Helen Martinez, [place] E, PA in 5-7 days  Please get a complete blood count and chemistry panel checked by your Primary MD at your next visit, and again as instructed by your Primary MD. Please get your medications reviewed and adjusted by your Primary MD.  Please request your Primary MD to go over all Hospital Tests and Procedure/Radiological results at the follow up, please get all Hospital records sent to your Prim MD by signing hospital release before you go home.  In some cases, there will be blood work, cultures and biopsy results pending at the time of your discharge. Please request that your primary care M.D. goes through all the records of your hospital data and follows up on these results.  If you had Pneumonia of Lung problems at the Hospital: Please get a 2 view Chest X ray done in 6-8 weeks after hospital discharge or sooner if instructed by your Primary MD.  If you have Congestive Heart Failure: Please call your Cardiologist or Primary MD anytime you have any of the following symptoms:  1) 3 pound weight gain in 24 hours or 5 pounds in 1 week  2) shortness of breath, with or without a dry hacking cough  3) swelling in the hands, feet or stomach  4) if you have to sleep on extra pillows at night in order to breathe  Follow cardiac low salt diet and 1.5 lit/day fluid restriction.  If you have diabetes Accuchecks 4 times/day, Once in AM empty stomach and then before each meal. Log in all results and show them to your primary doctor at your next visit. If any glucose reading is under 80 or above 300 call your primary MD immediately.  If you have Seizure/Convulsions/Epilepsy: Please do not drive, operate heavy machinery, participate in activities at heights or participate in high speed sports until you have seen by Primary MD or a Neurologist and advised to do so again. Per [facility] statutes, patients with seizures are not allowed to drive until they have been  seizure-free for six months.  Use caution when using heavy equipment or power tools. Avoid working on ladders or at heights. Take showers instead of baths. Ensure the water temperature is not too high on the home water heater. Do not go swimming alone. Do not lock yourself in a room alone (i.e. bathroom). When caring for infants or small children, sit down when holding, feeding, or changing them to minimize risk of injury to the child in the event you have a seizure. Maintain good sleep hygiene. Avoid alcohol.   If you had Gastrointestinal Bleeding: Please ask your Primary MD to check a complete blood count within one week of discharge or at your next visit. Your endoscopic/colonoscopic biopsies that are pending at the time of discharge, will also need to followed by your Primary MD.  Get Medicines reviewed and adjusted. Please take all your medications with you for your next visit with your Primary MD  Please request your Primary MD to go over all hospital tests and procedure/radiological results at the follow up, please ask your Primary MD to get all Hospital records sent to his/her office.  If you experience worsening of your admission symptoms, develop shortness of breath, life threatening emergency, suicidal or homicidal thoughts you must seek medical attention immediately by calling 911 or calling your MD immediately  if symptoms less severe.  You must read complete instructions/literature along with all the possible adverse reactions/side effects for all the Medicines you  take and that have been prescribed to you. Take any new Medicines after you have completely understood and accpet all the possible adverse reactions/side effects.   Do not drive or operate heavy machinery when taking Pain medications.   Do not take more than prescribed Pain, Sleep and Anxiety Medications  Special Instructions: If you have smoked or chewed Tobacco  in the last 2 yrs please stop smoking, stop any regular  Alcohol  and or any Recreational drug use.  Wear Seat belts while driving.  Please note You were cared for by a hospitalist during your hospital stay. If you have any questions about your discharge medications or the care you received while you were in the hospital after you are discharged, you can call the unit and asked to speak with the hospitalist on call if the hospitalist that took care of you is not available. Once you are discharged, your primary care physician will handle any further medical issues. Please note that NO REFILLS for any discharge medications will be authorized once you are discharged, as it is imperative that you return to your primary care physician (or establish a relationship with a primary care physician if you do not have one) for your aftercare needs so that they can reassess your need for medications and monitor your lab values.  You can reach the hospitalist office at phone [phone] or fax [phone]   If you do not have a primary care physician, you can call [phone] for a physician referral.  Activity: As tolerated with Full fall precautions use walker/cane & assistance as needed    Diet: regular  Disposition Home

## 2023-02-24 LAB — CULTURE, BLOOD (ROUTINE X 2)

## 2023-02-25 LAB — CULTURE, BLOOD (ROUTINE X 2)
Culture: NO GROWTH
Special Requests: ADEQUATE

## 2023-02-26 LAB — CULTURE, BLOOD (ROUTINE X 2)

## 2023-02-27 LAB — CULTURE, BLOOD (ROUTINE X 2): Culture: NO GROWTH

## 2023-03-07 ENCOUNTER — Other Ambulatory Visit: Payer: Self-pay

## 2023-03-07 MED ORDER — AMLODIPINE BESYLATE 5 MG PO TABS
5.0000 mg | ORAL_TABLET | Freq: Every day | ORAL | 3 refills | Status: DC
  Filled 2023-03-07: qty 90, 90d supply, fill #0

## 2023-03-07 MED ORDER — VENLAFAXINE HCL ER 150 MG PO CP24
150.0000 mg | ORAL_CAPSULE | Freq: Every day | ORAL | 3 refills | Status: DC
  Filled 2023-03-07: qty 90, 90d supply, fill #0
  Filled 2023-06-02: qty 90, 90d supply, fill #1

## 2023-03-20 ENCOUNTER — Other Ambulatory Visit: Payer: Self-pay

## 2023-03-20 MED ORDER — GLIPIZIDE 2.5 MG PO TABS
1.0000 | ORAL_TABLET | Freq: Every day | ORAL | 2 refills | Status: DC
  Filled 2023-03-20 – 2023-04-14 (×4): qty 30, 30d supply, fill #0

## 2023-03-21 ENCOUNTER — Other Ambulatory Visit: Payer: Self-pay

## 2023-03-27 ENCOUNTER — Other Ambulatory Visit: Payer: Self-pay

## 2023-03-27 MED ORDER — JARDIANCE 25 MG PO TABS
25.0000 mg | ORAL_TABLET | Freq: Every day | ORAL | 3 refills | Status: DC
  Filled 2023-03-27: qty 30, 30d supply, fill #0

## 2023-03-30 ENCOUNTER — Other Ambulatory Visit: Payer: Self-pay

## 2023-03-31 ENCOUNTER — Other Ambulatory Visit: Payer: Self-pay

## 2023-04-06 ENCOUNTER — Other Ambulatory Visit: Payer: Self-pay

## 2023-04-06 ENCOUNTER — Encounter: Payer: Self-pay | Admitting: Oncology

## 2023-04-06 MED ORDER — INVOKANA 100 MG PO TABS
100.0000 mg | ORAL_TABLET | Freq: Every day | ORAL | 0 refills | Status: AC
  Filled 2023-04-06: qty 30, 30d supply, fill #0

## 2023-04-07 ENCOUNTER — Encounter: Payer: Self-pay | Admitting: Oncology

## 2023-04-07 ENCOUNTER — Other Ambulatory Visit: Payer: Self-pay

## 2023-04-13 ENCOUNTER — Encounter: Payer: Self-pay | Admitting: Oncology

## 2023-04-13 ENCOUNTER — Other Ambulatory Visit: Payer: Self-pay

## 2023-04-13 MED ORDER — DAPAGLIFLOZIN PROPANEDIOL 10 MG PO TABS
ORAL_TABLET | ORAL | 1 refills | Status: DC
Start: 2023-04-13 — End: 2024-07-12
  Filled 2023-04-13: qty 30, 30d supply, fill #0

## 2023-04-14 ENCOUNTER — Other Ambulatory Visit: Payer: Self-pay

## 2023-04-20 ENCOUNTER — Encounter: Payer: Self-pay | Admitting: Oncology

## 2023-04-20 ENCOUNTER — Other Ambulatory Visit: Payer: Self-pay

## 2023-06-05 ENCOUNTER — Other Ambulatory Visit: Payer: Self-pay

## 2023-08-17 ENCOUNTER — Other Ambulatory Visit: Payer: Self-pay

## 2023-08-21 ENCOUNTER — Encounter: Payer: Self-pay | Admitting: Oncology

## 2023-08-21 ENCOUNTER — Other Ambulatory Visit: Payer: Self-pay

## 2023-08-21 MED ORDER — VENLAFAXINE HCL ER 150 MG PO CP24: 150.0000 mg | ORAL_CAPSULE | Freq: Every day | ORAL | 3 refills | Status: DC

## 2023-08-21 MED ORDER — AMLODIPINE BESYLATE 5 MG PO TABS
5.0000 mg | ORAL_TABLET | Freq: Every day | ORAL | 3 refills | Status: DC
  Filled 2023-08-21: qty 90, 90d supply, fill #0

## 2023-08-21 MED ORDER — LOSARTAN POTASSIUM 50 MG PO TABS
50.0000 mg | ORAL_TABLET | Freq: Every day | ORAL | 3 refills | Status: DC
  Filled 2023-08-21: qty 90, 90d supply, fill #0

## 2023-08-21 MED ORDER — OZEMPIC (2 MG/DOSE) 8 MG/3ML ~~LOC~~ SOPN
2.0000 mg | PEN_INJECTOR | SUBCUTANEOUS | 1 refills | Status: DC
  Filled 2023-08-21: qty 3, 28d supply, fill #0

## 2023-08-21 MED ORDER — GLIPIZIDE 2.5 MG PO TABS
2.5000 mg | ORAL_TABLET | Freq: Every day | ORAL | 2 refills | Status: DC
  Filled 2023-08-21: qty 30, 30d supply, fill #0

## 2023-08-21 MED ORDER — ATORVASTATIN CALCIUM 10 MG PO TABS
10.0000 mg | ORAL_TABLET | Freq: Every day | ORAL | 3 refills | Status: DC
  Filled 2023-08-21: qty 90, 90d supply, fill #0

## 2023-08-21 MED ORDER — DAPAGLIFLOZIN PROPANEDIOL 10 MG PO TABS
10.0000 mg | ORAL_TABLET | Freq: Every day | ORAL | 1 refills | Status: DC
  Filled 2023-08-21: qty 90, 90d supply, fill #0

## 2023-08-29 ENCOUNTER — Other Ambulatory Visit: Payer: Self-pay

## 2024-01-05 ENCOUNTER — Encounter: Payer: Self-pay | Admitting: Oncology

## 2024-01-12 ENCOUNTER — Encounter: Payer: Self-pay | Admitting: Oncology

## 2024-01-12 ENCOUNTER — Ambulatory Visit: Payer: No Typology Code available for payment source | Admitting: Nurse Practitioner

## 2024-01-12 NOTE — Progress Notes (Deleted)
 There were no vitals taken for this visit.   Subjective:    Patient ID: Helen Martinez, female    DOB: [birth year], 53 y.o.   MRN: [id number]  HPI: Helen Martinez is a 53 y.o. female  No chief complaint on file.   Discussed the use of AI scribe software for clinical note transcription with the patient, who gave verbal consent to proceed.  History of Present Illness           No data to display          Relevant past medical, surgical, family and social history reviewed and updated as indicated. Interim medical history since our last visit reviewed. Allergies and medications reviewed and updated.  Review of Systems  Per HPI unless specifically indicated above     Objective:      There were no vitals taken for this visit.  {Vitals History (Optional):23777} Wt Readings from Last 3 Encounters:  02/22/23 177 lb (80.3 kg)  12/02/19 180 lb (81.6 kg)  11/01/18 176 lb (79.8 kg)    Physical Exam Physical Exam    Results for orders placed or performed during the hospital encounter of 02/22/23  Culture, blood (routine x 2)   Collection Time: 02/22/23 12:06 PM   Specimen: BLOOD  Result Value Ref Range   Specimen Description      BLOOD LEFT ANTECUBITAL Performed at [facility], [address] [place], [place] [address]    Special Requests      Blood Culture adequate volume BOTTLES DRAWN AEROBIC AND ANAEROBIC Performed at [facility], [address] [place], [place] [address]    Culture      NO GROWTH 5 DAYS Performed at [facility], [address], [place], [place] [address]    Report Status 02/27/2023 FINAL   Culture, blood (routine x 2)   Collection Time: 02/22/23 12:06 PM   Specimen: BLOOD  Result Value Ref Range   Specimen Description      BLOOD BLOOD RIGHT FOREARM Performed at [facility], [address], [place], [place] [address]    Special Requests      Blood Culture adequate  volume BOTTLES DRAWN AEROBIC AND ANAEROBIC Performed at [facility], [address], [place], [place] [address]    Culture      NO GROWTH 5 DAYS Performed at [facility], [address] [place], [place] [address]    Report Status 02/27/2023 FINAL   Basic metabolic panel   Collection Time: 02/22/23 12:06 PM  Result Value Ref Range   Sodium 136 135 - 145 mmol/L   Potassium 3.8 3.5 - 5.1 mmol/L   Chloride 101 98 - 111 mmol/L   CO2 26 22 - 32 mmol/L   Glucose, Bld 202 (H) 70 - 99 mg/dL   BUN 9 6 - 20 mg/dL   Creatinine, Ser 4.40 0.44 - 1.00 mg/dL   Calcium  9.2 8.9 - 10.3 mg/dL   GFR, Estimated >10 >27 mL/min   Anion gap 9 5 - 15  CBC with Differential   Collection Time: 02/22/23 12:06 PM  Result Value Ref Range   WBC 7.3 4.0 - 10.5 K/uL   RBC 4.71 3.87 - 5.11 MIL/uL   Hemoglobin 14.7 12.0 - 15.0 g/dL   HCT 25.3 66.4 - 40.3 %   MCV 92.8 80.0 - 100.0 fL   MCH 31.2 26.0 - 34.0 pg   MCHC 33.6 30.0 - 36.0 g/dL   RDW 12.3  11.5 - 15.5 %   Platelets 183 150 - 400 K/uL   nRBC 0.0 0.0 - 0.2 %   Neutrophils Relative % 63 %   Neutro Abs 4.6 1.7 - 7.7 K/uL   Lymphocytes Relative 27 %   Lymphs Abs 2.0 0.7 - 4.0 K/uL   Monocytes Relative 8 %   Monocytes Absolute 0.6 0.1 - 1.0 K/uL   Eosinophils Relative 1 %   Eosinophils Absolute 0.1 0.0 - 0.5 K/uL   Basophils Relative 1 %   Basophils Absolute 0.0 0.0 - 0.1 K/uL   Immature Granulocytes 0 %   Abs Immature Granulocytes 0.02 0.00 - 0.07 K/uL  Lactic acid, plasma   Collection Time: 02/22/23 12:06 PM  Result Value Ref Range   Lactic Acid, Venous 2.0 (HH) 0.5 - 1.9 mmol/L  Lactic acid, plasma   Collection Time: 02/22/23  1:57 PM  Result Value Ref Range   Lactic Acid, Venous 1.9 0.5 - 1.9 mmol/L  CBG monitoring, ED   Collection Time: 02/22/23  3:26 PM  Result Value Ref Range   Glucose-Capillary 186 (H) 70 - 99 mg/dL  Glucose, capillary   Collection Time: 02/22/23 10:49 PM  Result Value Ref Range    Glucose-Capillary 187 (H) 70 - 99 mg/dL  HIV Antibody (routine testing w rflx)   Collection Time: 02/23/23  4:41 AM  Result Value Ref Range   HIV Screen 4th Generation wRfx Non Reactive Non Reactive  CBC   Collection Time: 02/23/23  4:41 AM  Result Value Ref Range   WBC 6.4 4.0 - 10.5 K/uL   RBC 4.81 3.87 - 5.11 MIL/uL   Hemoglobin 14.9 12.0 - 15.0 g/dL   HCT 62.1 30.8 - 65.7 %   MCV 95.6 80.0 - 100.0 fL   MCH 31.0 26.0 - 34.0 pg   MCHC 32.4 30.0 - 36.0 g/dL   RDW 84.6 96.2 - 95.2 %   Platelets 187 150 - 400 K/uL   nRBC 0.0 0.0 - 0.2 %  Basic metabolic panel   Collection Time: 02/23/23  4:41 AM  Result Value Ref Range   Sodium 135 135 - 145 mmol/L   Potassium 3.8 3.5 - 5.1 mmol/L   Chloride 100 98 - 111 mmol/L   CO2 27 22 - 32 mmol/L   Glucose, Bld 163 (H) 70 - 99 mg/dL   BUN 8 6 - 20 mg/dL   Creatinine, Ser 8.41 0.44 - 1.00 mg/dL   Calcium  9.0 8.9 - 10.3 mg/dL   GFR, Estimated >32 >44 mL/min   Anion gap 8 5 - 15  Glucose, capillary   Collection Time: 02/23/23  7:45 AM  Result Value Ref Range   Glucose-Capillary 183 (H) 70 - 99 mg/dL  Glucose, capillary   Collection Time: 02/23/23 11:51 AM  Result Value Ref Range   Glucose-Capillary 158 (H) 70 - 99 mg/dL   {Labs (WNUUVOZD):66440}       Assessment & Plan:   Problem List Items Addressed This Visit   None    Assessment and Plan Assessment & Plan         Follow up plan: No follow-ups on file.

## 2024-05-29 ENCOUNTER — Encounter: Payer: Self-pay | Admitting: Oncology

## 2024-07-05 ENCOUNTER — Encounter: Payer: Self-pay | Admitting: Oncology

## 2024-07-10 ENCOUNTER — Encounter: Payer: Self-pay | Admitting: Oncology

## 2024-07-12 ENCOUNTER — Ambulatory Visit: Admitting: Nurse Practitioner

## 2024-07-12 ENCOUNTER — Encounter: Payer: Self-pay | Admitting: Nurse Practitioner

## 2024-07-12 ENCOUNTER — Other Ambulatory Visit: Payer: Self-pay | Admitting: Nurse Practitioner

## 2024-07-12 VITALS — BP 153/91 | HR 86 | Temp 98.2°F | Ht 62.0 in | Wt 156.2 lb

## 2024-07-12 DIAGNOSIS — Z23 Encounter for immunization: Secondary | ICD-10-CM

## 2024-07-12 DIAGNOSIS — Z7689 Persons encountering health services in other specified circumstances: Secondary | ICD-10-CM

## 2024-07-12 DIAGNOSIS — E1159 Type 2 diabetes mellitus with other circulatory complications: Secondary | ICD-10-CM | POA: Diagnosis not present

## 2024-07-12 DIAGNOSIS — F418 Other specified anxiety disorders: Secondary | ICD-10-CM | POA: Diagnosis not present

## 2024-07-12 DIAGNOSIS — Z7985 Long-term (current) use of injectable non-insulin antidiabetic drugs: Secondary | ICD-10-CM

## 2024-07-12 DIAGNOSIS — E785 Hyperlipidemia, unspecified: Secondary | ICD-10-CM

## 2024-07-12 DIAGNOSIS — I152 Hypertension secondary to endocrine disorders: Secondary | ICD-10-CM

## 2024-07-12 DIAGNOSIS — E1169 Type 2 diabetes mellitus with other specified complication: Secondary | ICD-10-CM

## 2024-07-12 LAB — MICROALBUMIN, URINE WAIVED
Creatinine, Urine Waived: 50 mg/dL (ref 10–300)
Microalb, Ur Waived: 30 mg/L — ABNORMAL HIGH (ref 0–19)

## 2024-07-12 LAB — BAYER DCA HB A1C WAIVED: HB A1C (BAYER DCA - WAIVED): 10.7 % — ABNORMAL HIGH (ref 4.8–5.6)

## 2024-07-12 MED ORDER — VENLAFAXINE HCL ER 150 MG PO CP24: 150.0000 mg | ORAL_CAPSULE | Freq: Every day | ORAL | 1 refills | Status: AC

## 2024-07-12 MED ORDER — OZEMPIC (0.25 OR 0.5 MG/DOSE) 2 MG/3ML ~~LOC~~ SOPN
0.2500 mg | PEN_INJECTOR | SUBCUTANEOUS | 0 refills | Status: AC
Start: 2024-07-12 — End: ?

## 2024-07-12 MED ORDER — ATORVASTATIN CALCIUM 10 MG PO TABS: 10.0000 mg | ORAL_TABLET | Freq: Every day | ORAL | 1 refills | Status: AC

## 2024-07-12 MED ORDER — EMPAGLIFLOZIN 25 MG PO TABS: 25.0000 mg | ORAL_TABLET | Freq: Every day | ORAL | 1 refills | Status: AC

## 2024-07-12 MED ORDER — LOSARTAN POTASSIUM 50 MG PO TABS: 50.0000 mg | ORAL_TABLET | Freq: Every day | ORAL | 1 refills | Status: AC

## 2024-07-12 MED ORDER — GLIPIZIDE 2.5 MG PO TABS: 1.0000 | ORAL_TABLET | Freq: Two times a day (BID) | ORAL | 0 refills | Status: AC

## 2024-07-12 NOTE — Assessment & Plan Note (Signed)
 Chronic. Not well controlled.  Has been out of her Losartan .  Will restart Losartan  50mg  daily.  Recommend checking blood pressures at home and bringing log to next visit.  Follow up in 1 month.  Call sooner if concerns arise.

## 2024-07-12 NOTE — Assessment & Plan Note (Signed)
 Chronic. Not well controlled.  A1c in office today was 10.7%.  Will restart Ozempic  0.25mg  weekly, Jardiance  25mg  and Glipizide  2.5mg  BID.  Microalbumin checked.  Needs up dated eye exam.  Follow up in 1 month for medication titration.

## 2024-07-12 NOTE — Assessment & Plan Note (Signed)
 Chronic. Has not been on atorvastatin .  Will restart at visit today.  Labs ordered today.  Return to clinic in 6 months for reevaluation.  Call sooner if concerns arise.

## 2024-07-12 NOTE — Progress Notes (Signed)
 BP (!) 153/91 (BP Location: Right Arm, Cuff Size: Normal)   Pulse 86   Temp 98.2 F (36.8 C) (Oral)   Ht 5' 2 (1.575 m)   Wt 156 lb 3.2 oz (70.9 kg)   SpO2 97%   BMI 28.57 kg/m    Subjective:    Patient ID: Helen Martinez, female    DOB: [birth year], 53 y.o.   MRN: [id number]  HPI: Helen Martinez is a 53 y.o. female  Chief Complaint  Patient presents with   Diabetes    No recent eye exam per patient   Hyperlipidemia   Hypertension   Patient presents to clinic to establish care with new PCP.  Introduced to publishing rights manager role and practice setting.  All questions answered.  Discussed provider/patient relationship and expectations.  Patient reports a history of HTN, HLD, DM2, Depression and anxiety.  Current she is current everyday smoker- 1ppd.    Patient denies a history of: Thyroid problems,  Neurological problems, and Abdominal problems.   DIABETES Patient states she lost her insurance and wasn't able to take to take her medication.  She has lost 30lbs of weight loss.  Has tried Metformin  in the past and had very bad diarrhea.  Hypoglycemic episodes:no Polydipsia/polyuria: no Visual disturbance: no Chest pain: no Paresthesias: no Glucose Monitoring: no  Accucheck frequency: Not Checking  Fasting glucose:  Post prandial:  Evening:  Before meals: Taking Insulin ?: no  Long acting insulin :  Short acting insulin : Blood Pressure Monitoring: not checking Retinal Examination: Not up to Date Foot Exam: Up to Date Diabetic Education: Not Completed Pneumovax: Up to Date Influenza: Up to Date Aspirin: no  HYPERTENSION / HYPERLIPIDEMIA Supposed to be on Losartan  but hasn't had the medicaiton.  Only currently taking Amlodipine . Satisfied with current treatment? no Duration of hypertension: years BP monitoring frequency: not checking BP range:  BP medication side effects: no Past BP meds: losartan  (cozaar ) Duration of hyperlipidemia: years Cholesterol medication  side effects: no Cholesterol supplements: none Past cholesterol medications: atorvastain (lipitor) Medication compliance: excellent compliance Aspirin: no Recent stressors: no Recurrent headaches: no Visual changes: no Palpitations: no Dyspnea: no Chest pain: no Lower extremity edema: no Dizzy/lightheaded: no  DEPRESSION/ANXIETY She has been taking Effexor  for several years.  Feels like symptoms are well controlled.  Denies concerns at visit today.    Flowsheet Row Office Visit from 07/12/2024 in [facility]  PHQ-9 Total Score 0      07/12/2024    9:22 AM  GAD 7 : Generalized Anxiety Score  Nervous, Anxious, on Edge 0  Control/stop worrying 0  Worry too much - different things 0  Trouble relaxing 0  Restless 0  Easily annoyed or irritable 0  Afraid - awful might happen 0  Total GAD 7 Score 0  Anxiety Difficulty Not difficult at all       Active Ambulatory Problems    Diagnosis Date Noted   Contusion of lower back 09/18/2013   Anal fissure 06/02/2016   Rectal pain 06/02/2016   Loose stools 06/02/2016   Porphyria cutanea tarda (HCC) 01/08/2017   Hypertension associated with diabetes (HCC) 09/23/2015   AKI (acute kidney injury) 12/02/2019   Cellulitis of left lower extremity 02/22/2023   Bitten by pig 02/22/2023   Type 2 diabetes mellitus (HCC) 02/22/2023   Hyperlipidemia associated with type 2 diabetes mellitus (HCC) 02/22/2023   Depression with anxiety 02/22/2023   Resolved Ambulatory Problems    Diagnosis Date Noted   No Resolved  Ambulatory Problems   Past Medical History:  Diagnosis Date   Depression    Diabetes mellitus without complication (HCC)    GERD (gastroesophageal reflux disease)    Hypertension    Past Surgical History:  Procedure Laterality Date   BREAST BIOPSY Right 06/14/2017   Benign   TOTAL ABDOMINAL HYSTERECTOMY     Family History  Problem Relation Age of Onset   Hypertension Mother    Cancer Father         mouth then mets   Breast cancer Maternal Aunt 54     Review of Systems  Eyes:  Negative for visual disturbance.  Respiratory:  Negative for cough, chest tightness and shortness of breath.   Cardiovascular:  Negative for chest pain, palpitations and leg swelling.  Endocrine: Negative for polydipsia and polyuria.  Neurological:  Negative for dizziness, numbness and headaches.  Psychiatric/Behavioral:  Positive for dysphoric mood. Negative for suicidal ideas. The patient is nervous/anxious.     Per HPI unless specifically indicated above     Objective:    BP (!) 153/91 (BP Location: Right Arm, Cuff Size: Normal)   Pulse 86   Temp 98.2 F (36.8 C) (Oral)   Ht 5' 2 (1.575 m)   Wt 156 lb 3.2 oz (70.9 kg)   SpO2 97%   BMI 28.57 kg/m   Wt Readings from Last 3 Encounters:  07/12/24 156 lb 3.2 oz (70.9 kg)  02/22/23 177 lb (80.3 kg)  12/02/19 180 lb (81.6 kg)    Physical Exam Vitals and nursing note reviewed.  Constitutional:      General: She is not in acute distress.    Appearance: Normal appearance. She is normal weight. She is not ill-appearing, toxic-appearing or diaphoretic.  HENT:     Head: Normocephalic.     Right Ear: External ear normal.     Left Ear: External ear normal.     Nose: Nose normal.     Mouth/Throat:     Mouth: Mucous membranes are moist.     Pharynx: Oropharynx is clear.  Eyes:     General:        Right eye: No discharge.        Left eye: No discharge.     Extraocular Movements: Extraocular movements intact.     Conjunctiva/sclera: Conjunctivae normal.     Pupils: Pupils are equal, round, and reactive to light.  Cardiovascular:     Rate and Rhythm: Normal rate and regular rhythm.     Heart sounds: No murmur heard. Pulmonary:     Effort: Pulmonary effort is normal. No respiratory distress.     Breath sounds: Normal breath sounds. No wheezing or rales.  Musculoskeletal:     Cervical back: Normal range of motion and neck supple.  Skin:     General: Skin is warm and dry.     Capillary Refill: Capillary refill takes less than 2 seconds.  Neurological:     General: No focal deficit present.     Mental Status: She is alert and oriented to person, place, and time. Mental status is at baseline.  Psychiatric:        Mood and Affect: Mood normal.        Behavior: Behavior normal.        Thought Content: Thought content normal.        Judgment: Judgment normal.     Results for orders placed or performed during the hospital encounter of 02/22/23  Culture, blood (routine x 2)  Collection Time: 02/22/23 12:06 PM   Specimen: BLOOD  Result Value Ref Range   Specimen Description      BLOOD LEFT ANTECUBITAL Performed at [facility], [address] [place], [place] [address]    Special Requests      Blood Culture adequate volume BOTTLES DRAWN AEROBIC AND ANAEROBIC Performed at [facility], [address] [place], [place] [address]    Culture      NO GROWTH 5 DAYS Performed at [facility], [address] [place], [place] [address]    Report Status 02/27/2023 FINAL   Culture, blood (routine x 2)   Collection Time: 02/22/23 12:06 PM   Specimen: BLOOD  Result Value Ref Range   Specimen Description      BLOOD BLOOD RIGHT FOREARM Performed at [facility], [address] [place], [place] [address]    Special Requests      Blood Culture adequate volume BOTTLES DRAWN AEROBIC AND ANAEROBIC Performed at [facility], [address], [place], [place] [address]    Culture      NO GROWTH 5 DAYS Performed at [facility], [address] [place], [place] [address]    Report Status 02/27/2023 FINAL   Basic metabolic panel   Collection Time: 02/22/23 12:06 PM  Result Value Ref Range   Sodium 136 135 - 145 mmol/L   Potassium 3.8 3.5 - 5.1 mmol/L   Chloride 101 98 - 111 mmol/L   CO2 26 22 - 32 mmol/L   Glucose, Bld 202 (H) 70 - 99  mg/dL   BUN 9 6 - 20 mg/dL   Creatinine, Ser 9.48 0.44 - 1.00 mg/dL   Calcium  9.2 8.9 - 10.3 mg/dL   GFR, Estimated >39 >39 mL/min   Anion gap 9 5 - 15  CBC with Differential   Collection Time: 02/22/23 12:06 PM  Result Value Ref Range   WBC 7.3 4.0 - 10.5 K/uL   RBC 4.71 3.87 - 5.11 MIL/uL   Hemoglobin 14.7 12.0 - 15.0 g/dL   HCT 56.2 63.9 - 53.9 %   MCV 92.8 80.0 - 100.0 fL   MCH 31.2 26.0 - 34.0 pg   MCHC 33.6 30.0 - 36.0 g/dL   RDW 87.6 88.4 - 84.4 %   Platelets 183 150 - 400 K/uL   nRBC 0.0 0.0 - 0.2 %   Neutrophils Relative % 63 %   Neutro Abs 4.6 1.7 - 7.7 K/uL   Lymphocytes Relative 27 %   Lymphs Abs 2.0 0.7 - 4.0 K/uL   Monocytes Relative 8 %   Monocytes Absolute 0.6 0.1 - 1.0 K/uL   Eosinophils Relative 1 %   Eosinophils Absolute 0.1 0.0 - 0.5 K/uL   Basophils Relative 1 %   Basophils Absolute 0.0 0.0 - 0.1 K/uL   Immature Granulocytes 0 %   Abs Immature Granulocytes 0.02 0.00 - 0.07 K/uL  Lactic acid, plasma   Collection Time: 02/22/23 12:06 PM  Result Value Ref Range   Lactic Acid, Venous 2.0 (HH) 0.5 - 1.9 mmol/L  Lactic acid, plasma   Collection Time: 02/22/23  1:57 PM  Result Value Ref Range   Lactic Acid, Venous 1.9 0.5 - 1.9 mmol/L  CBG monitoring, ED   Collection Time: 02/22/23  3:26 PM  Result Value Ref Range   Glucose-Capillary 186 (H) 70 - 99 mg/dL  Glucose, capillary   Collection Time: 02/22/23 10:49 PM  Result Value Ref Range   Glucose-Capillary  187 (H) 70 - 99 mg/dL  HIV Antibody (routine testing w rflx)   Collection Time: 02/23/23  4:41 AM  Result Value Ref Range   HIV Screen 4th Generation wRfx Non Reactive Non Reactive  CBC   Collection Time: 02/23/23  4:41 AM  Result Value Ref Range   WBC 6.4 4.0 - 10.5 K/uL   RBC 4.81 3.87 - 5.11 MIL/uL   Hemoglobin 14.9 12.0 - 15.0 g/dL   HCT 53.9 63.9 - 53.9 %   MCV 95.6 80.0 - 100.0 fL   MCH 31.0 26.0 - 34.0 pg   MCHC 32.4 30.0 - 36.0 g/dL   RDW 87.9 88.4 - 84.4 %   Platelets 187 150 - 400  K/uL   nRBC 0.0 0.0 - 0.2 %  Basic metabolic panel   Collection Time: 02/23/23  4:41 AM  Result Value Ref Range   Sodium 135 135 - 145 mmol/L   Potassium 3.8 3.5 - 5.1 mmol/L   Chloride 100 98 - 111 mmol/L   CO2 27 22 - 32 mmol/L   Glucose, Bld 163 (H) 70 - 99 mg/dL   BUN 8 6 - 20 mg/dL   Creatinine, Ser 9.45 0.44 - 1.00 mg/dL   Calcium  9.0 8.9 - 10.3 mg/dL   GFR, Estimated >39 >39 mL/min   Anion gap 8 5 - 15  Glucose, capillary   Collection Time: 02/23/23  7:45 AM  Result Value Ref Range   Glucose-Capillary 183 (H) 70 - 99 mg/dL  Glucose, capillary   Collection Time: 02/23/23 11:51 AM  Result Value Ref Range   Glucose-Capillary 158 (H) 70 - 99 mg/dL      Assessment & Plan:   Problem List Items Addressed This Visit       Cardiovascular and Mediastinum   Hypertension associated with diabetes (HCC)   Chronic. Not well controlled.  Has been out of her Losartan .  Will restart Losartan  50mg  daily.  Recommend checking blood pressures at home and bringing log to next visit.  Follow up in 1 month.  Call sooner if concerns arise.      Relevant Medications   atorvastatin  (LIPITOR) 10 MG tablet   losartan  (COZAAR ) 50 MG tablet   Semaglutide ,0.25 or 0.5MG /DOS, (OZEMPIC , 0.25 OR 0.5 MG/DOSE,) 2 MG/3ML SOPN   glipiZIDE  2.5 MG TABS   empagliflozin  (JARDIANCE ) 25 MG TABS tablet     Endocrine   Type 2 diabetes mellitus (HCC) - Primary   Chronic. Not well controlled.  A1c in office today was 10.7%.  Will restart Ozempic  0.25mg  weekly, Jardiance  25mg  and Glipizide  2.5mg  BID.  Microalbumin checked.  Needs up dated eye exam.  Follow up in 1 month for medication titration.      Relevant Medications   atorvastatin  (LIPITOR) 10 MG tablet   losartan  (COZAAR ) 50 MG tablet   Semaglutide ,0.25 or 0.5MG /DOS, (OZEMPIC , 0.25 OR 0.5 MG/DOSE,) 2 MG/3ML SOPN   glipiZIDE  2.5 MG TABS   empagliflozin  (JARDIANCE ) 25 MG TABS tablet   Other Relevant Orders   Comprehensive metabolic panel with GFR    Microalbumin, Urine Waived   Bayer DCA Hb A1c Waived   Hyperlipidemia associated with type 2 diabetes mellitus (HCC)   Chronic. Has not been on atorvastatin .  Will restart at visit today.  Labs ordered today.  Return to clinic in 6 months for reevaluation.  Call sooner if concerns arise.        Relevant Medications   atorvastatin  (LIPITOR) 10 MG tablet   losartan  (COZAAR ) 50 MG  tablet   Semaglutide ,0.25 or 0.5MG /DOS, (OZEMPIC , 0.25 OR 0.5 MG/DOSE,) 2 MG/3ML SOPN   glipiZIDE  2.5 MG TABS   empagliflozin  (JARDIANCE ) 25 MG TABS tablet   Other Relevant Orders   Lipid panel     Other   Depression with anxiety   Chronic.  Controlled.  Continue with current medication regimen of Venlafaxine  150mg .  Refills sent today.  Labs ordered today.  Return to clinic in 6 months for reevaluation.  Call sooner if concerns arise.        Relevant Medications   venlafaxine  XR (EFFEXOR -XR) 150 MG 24 hr capsule   Other Visit Diagnoses       Encounter to establish care         Need for influenza vaccination       Relevant Orders   Flu vaccine trivalent PF, 6mos and older(Flulaval,Afluria,Fluarix,Fluzone) (Completed)     Need for pneumococcal 20-valent conjugate vaccination       Relevant Orders   Pneumococcal conjugate vaccine 20-valent (Prevnar 20) (Completed)        Follow up plan: Return in about 1 month (around 08/11/2024) for Medication Management.

## 2024-07-12 NOTE — Telephone Encounter (Signed)
 Copied from CRM [id number]. Topic: Clinical - Medication Refill >> Jul 12, 2024  3:04 PM Emylou G wrote: Medication: glipiZIDE  2.5 MG TABS  Has the patient contacted their pharmacy? No (Agent: If no, request that the patient contact the pharmacy for the refill. If patient does not wish to contact the pharmacy document the reason why and proceed with request.) (Agent: If yes, when and what did the pharmacy advise?)  This is the patient's preferred pharmacy:  [facility] [address] [place] - [address] [place] [address] Phone: [phone] Fax: [phone]  Is this the correct pharmacy for this prescription? Yes If no, delete pharmacy and type the correct one.   Has the prescription been filled recently? No  Is the patient out of the medication? Yes  Has the patient been seen for an appointment in the last year OR does the patient have an upcoming appointment? Yes  Can we respond through MyChart? No  Agent: Please be advised that Rx refills may take up to 3 business days. We ask that you follow-up with your pharmacy.

## 2024-07-12 NOTE — Assessment & Plan Note (Signed)
 Chronic.  Controlled.  Continue with current medication regimen of Venlafaxine  150mg .  Refills sent today.  Labs ordered today.  Return to clinic in 6 months for reevaluation.  Call sooner if concerns arise.

## 2024-07-13 LAB — COMPREHENSIVE METABOLIC PANEL WITH GFR
ALT: 42 IU/L — ABNORMAL HIGH (ref 0–32)
AST: 29 IU/L (ref 0–40)
Albumin: 4.2 g/dL (ref 3.8–4.9)
Alkaline Phosphatase: 43 IU/L — ABNORMAL LOW (ref 49–135)
BUN/Creatinine Ratio: 24 — ABNORMAL HIGH (ref 9–23)
BUN: 13 mg/dL (ref 6–24)
Bilirubin Total: 0.2 mg/dL (ref 0.0–1.2)
CO2: 23 mmol/L (ref 20–29)
Calcium: 9.3 mg/dL (ref 8.7–10.2)
Chloride: 96 mmol/L (ref 96–106)
Creatinine, Ser: 0.55 mg/dL — ABNORMAL LOW (ref 0.57–1.00)
Globulin, Total: 2.3 g/dL (ref 1.5–4.5)
Glucose: 334 mg/dL — ABNORMAL HIGH (ref 70–99)
Potassium: 4.2 mmol/L (ref 3.5–5.2)
Sodium: 134 mmol/L (ref 134–144)
Total Protein: 6.5 g/dL (ref 6.0–8.5)
eGFR: 108 mL/min/1.73 (ref >59)

## 2024-07-13 LAB — LIPID PANEL
Chol/HDL Ratio: 7 ratio — ABNORMAL HIGH (ref 0.0–4.4)
Cholesterol, Total: 288 mg/dL — ABNORMAL HIGH (ref 100–199)
HDL: 41 mg/dL (ref >39)
LDL Chol Calc (NIH): 129 mg/dL — ABNORMAL HIGH (ref 0–99)
Triglycerides: 641 mg/dL (ref 0–149)
VLDL Cholesterol Cal: 118 mg/dL — ABNORMAL HIGH (ref 5–40)

## 2024-07-15 ENCOUNTER — Ambulatory Visit: Payer: Self-pay | Admitting: Nurse Practitioner

## 2024-07-15 NOTE — Telephone Encounter (Signed)
 Too soon for refill.  Requested Prescriptions  Pending Prescriptions Disp Refills   glipiZIDE  2.5 MG TABS 180 tablet 0    Sig: Take 1 tablet by mouth 2 (two) times daily.     Endocrinology:  Diabetes - Sulfonylureas Failed - 07/15/2024 12:30 PM      Failed - HBA1C is between 0 and 7.9 and within 180 days    HB A1C (BAYER DCA - WAIVED)  Date Value Ref Range Status  07/12/2024 10.7 (H) 4.8 - 5.6 % Final    Comment:             Prediabetes: 5.7 - 6.4          Diabetes: >6.4          Glycemic control for adults with diabetes: <7.0          Failed - Cr in normal range and within 360 days    Creatinine, Ser  Date Value Ref Range Status  07/12/2024 0.55 (L) 0.57 - 1.00 mg/dL Final         Passed - Valid encounter within last 6 months    Recent Outpatient Visits           3 days ago Type 2 diabetes mellitus with other specified complication, without long-term current use of insulin  [facility])   [facility] Melvin Pao, NP   12 years ago Screening for tuberculosis   [facility] Care at [facility], [place], PA-C

## 2024-08-13 ENCOUNTER — Ambulatory Visit: Admitting: Nurse Practitioner

## 2024-08-18 ENCOUNTER — Other Ambulatory Visit: Payer: Self-pay | Admitting: Nurse Practitioner

## 2024-08-20 ENCOUNTER — Ambulatory Visit: Admitting: Nurse Practitioner

## 2024-08-21 NOTE — Telephone Encounter (Signed)
 Requested Prescriptions  Pending Prescriptions Disp Refills   OZEMPIC , 0.25 OR 0.5 MG/DOSE, 2 MG/3ML SOPN [Pharmacy Med Name: Ozempic  (0.25 or 0.5 MG/DOSE) 2 MG/3ML Subcutaneous Solution Pen-injector] 3 mL 0    Sig: INJECT 0.25MG   SUBCUTANEOUSLY ONCE A WEEK     Endocrinology:  Diabetes - GLP-1 Receptor Agonists - semaglutide  Failed - 08/21/2024  9:44 AM      Failed - HBA1C in normal range and within 180 days    HB A1C (BAYER DCA - WAIVED)  Date Value Ref Range Status  07/12/2024 10.7 (H) 4.8 - 5.6 % Final    Comment:             Prediabetes: 5.7 - 6.4          Diabetes: >6.4          Glycemic control for adults with diabetes: <7.0          Failed - Cr in normal range and within 360 days    Creatinine, Ser  Date Value Ref Range Status  07/12/2024 0.55 (L) 0.57 - 1.00 mg/dL Final         Passed - Valid encounter within last 6 months    Recent Outpatient Visits           1 month ago Type 2 diabetes mellitus with other specified complication, without long-term current use of insulin  [facility])   [facility] Melvin Pao, NP   12 years ago Screening for tuberculosis   Primary Care at [facility] [place], PA-C

## 2024-09-12 ENCOUNTER — Ambulatory Visit: Admitting: Nurse Practitioner

## 2024-09-12 ENCOUNTER — Encounter: Payer: Self-pay | Admitting: Nurse Practitioner

## 2024-09-12 VITALS — BP 136/89 | HR 86 | Temp 97.8°F | Ht 62.01 in | Wt 159.0 lb

## 2024-09-12 DIAGNOSIS — E1169 Type 2 diabetes mellitus with other specified complication: Secondary | ICD-10-CM

## 2024-09-12 DIAGNOSIS — E1159 Type 2 diabetes mellitus with other circulatory complications: Secondary | ICD-10-CM

## 2024-09-12 DIAGNOSIS — I152 Hypertension secondary to endocrine disorders: Secondary | ICD-10-CM | POA: Diagnosis not present

## 2024-09-12 DIAGNOSIS — Z7985 Long-term (current) use of injectable non-insulin antidiabetic drugs: Secondary | ICD-10-CM | POA: Diagnosis not present

## 2024-09-12 MED ORDER — OZEMPIC (0.25 OR 0.5 MG/DOSE) 2 MG/3ML ~~LOC~~ SOPN: 0.5000 mg | PEN_INJECTOR | SUBCUTANEOUS | 0 refills | Status: AC

## 2024-09-12 NOTE — Assessment & Plan Note (Signed)
 Chronic.  Improved from prior.  Continue with current medication regimen.  Will check labs at next visit.

## 2024-09-12 NOTE — Assessment & Plan Note (Signed)
 Chronic.  Improved.  Tolerating medications well.  Will increase Ozempic  to 0.5mg  weekly.  Continue with Jardiance  and Glipizide .  Recommend checking sugars a couple of times weekly.  Follow up in 1 month.  Will check labs at next visit.

## 2024-09-12 NOTE — Progress Notes (Signed)
 "  BP 136/89 (BP Location: Left Arm, Cuff Size: Normal)   Pulse 86   Temp 97.8 F (36.6 C) (Oral)   Ht 5' 2.01 (1.575 m)   Wt 159 lb (72.1 kg)   SpO2 96%   BMI 29.07 kg/m    Subjective:    Patient ID: Helen Martinez, female    DOB: [birth year], 54 y.o.   MRN: [id number]  HPI: Helen Martinez is a 54 y.o. female  Chief Complaint  Patient presents with   Medication Management    1 month F/u   DIABETES Patient states she is tolerating her medications okay.  Using Glipizide , Jardiance  and Ozempic  0.25mg  weekly. She is feeling some headaches. Hypoglycemic episodes:no Polydipsia/polyuria: no Visual disturbance: no Chest pain: no Paresthesias: no Glucose Monitoring: sometimes  Accucheck frequency: 135  Fasting glucose:  Post prandial:  Evening:  Before meals: Taking Insulin ?: no  Long acting insulin :  Short acting insulin : Blood Pressure Monitoring: not checking Retinal Examination: Not up to Date Foot Exam: Up to Date Diabetic Education: Not Completed Pneumovax: Up to Date Influenza: Up to Date Aspirin: no  HYPERTENSION / HYPERLIPIDEMIA Supposed to be on Losartan  but hasn't had the medicaiton.  Only currently taking Amlodipine . Satisfied with current treatment? no Duration of hypertension: years BP monitoring frequency: not checking BP range:  BP medication side effects: no Past BP meds: losartan  (cozaar ) Duration of hyperlipidemia: years Cholesterol medication side effects: no Cholesterol supplements: none Past cholesterol medications: atorvastain (lipitor) Medication compliance: excellent compliance Aspirin: no Recent stressors: no Recurrent headaches: no Visual changes: no Palpitations: no Dyspnea: no Chest pain: no Lower extremity edema: no Dizzy/lightheaded: no  DEPRESSION/ANXIETY She has been taking Effexor  for several years.  Feels like symptoms are well controlled.  Denies concerns at visit today.  ...    Relevant past medical, surgical, family  and social history reviewed and updated as indicated. Interim medical history since our last visit reviewed. Allergies and medications reviewed and updated.  Review of Systems  Eyes:  Negative for visual disturbance.  Respiratory:  Negative for chest tightness and shortness of breath.   Cardiovascular:  Negative for chest pain, palpitations and leg swelling.  Endocrine: Negative for polydipsia and polyuria.  Neurological:  Negative for dizziness, light-headedness, numbness and headaches.    Per HPI unless specifically indicated above     Objective:    BP 136/89 (BP Location: Left Arm, Cuff Size: Normal)   Pulse 86   Temp 97.8 F (36.6 C) (Oral)   Ht 5' 2.01 (1.575 m)   Wt 159 lb (72.1 kg)   SpO2 96%   BMI 29.07 kg/m   Wt Readings from Last 3 Encounters:  09/12/24 159 lb (72.1 kg)  07/12/24 156 lb 3.2 oz (70.9 kg)  02/22/23 177 lb (80.3 kg)    Physical Exam Vitals and nursing note reviewed.  Constitutional:      General: She is not in acute distress.    Appearance: Normal appearance. She is not ill-appearing, toxic-appearing or diaphoretic.  HENT:     Head: Normocephalic.     Right Ear: External ear normal.     Left Ear: External ear normal.     Nose: Nose normal.     Mouth/Throat:     Mouth: Mucous membranes are moist.     Pharynx: Oropharynx is clear.  Eyes:     General:        Right eye: No discharge.        Left eye: No discharge.  Extraocular Movements: Extraocular movements intact.     Conjunctiva/sclera: Conjunctivae normal.     Pupils: Pupils are equal, round, and reactive to light.  Cardiovascular:     Rate and Rhythm: Normal rate and regular rhythm.     Heart sounds: No murmur heard. Pulmonary:     Effort: Pulmonary effort is normal. No respiratory distress.     Breath sounds: Normal breath sounds. No wheezing or rales.  Musculoskeletal:     Cervical back: Normal range of motion and neck supple.  Skin:    General: Skin is warm and dry.      Capillary Refill: Capillary refill takes less than 2 seconds.  Neurological:     General: No focal deficit present.     Mental Status: She is alert and oriented to person, place, and time. Mental status is at baseline.  Psychiatric:        Mood and Affect: Mood normal.        Behavior: Behavior normal.        Thought Content: Thought content normal.        Judgment: Judgment normal.     Results for orders placed or performed in visit on 07/12/24  Comprehensive metabolic panel with GFR   Collection Time: 07/12/24  9:28 AM  Result Value Ref Range   Glucose 334 (H) 70 - 99 mg/dL   BUN 13 6 - 24 mg/dL   Creatinine, Ser 9.44 (L) 0.57 - 1.00 mg/dL   eGFR 891 >40 fO/fpw/8.26   BUN/Creatinine Ratio 24 (H) 9 - 23   Sodium 134 134 - 144 mmol/L   Potassium 4.2 3.5 - 5.2 mmol/L   Chloride 96 96 - 106 mmol/L   CO2 23 20 - 29 mmol/L   Calcium  9.3 8.7 - 10.2 mg/dL   Total Protein 6.5 6.0 - 8.5 g/dL   Albumin 4.2 3.8 - 4.9 g/dL   Globulin, Total 2.3 1.5 - 4.5 g/dL   Bilirubin Total 0.2 0.0 - 1.2 mg/dL   Alkaline Phosphatase 43 (L) 49 - 135 IU/L   AST 29 0 - 40 IU/L   ALT 42 (H) 0 - 32 IU/L  Lipid panel   Collection Time: 07/12/24  9:28 AM  Result Value Ref Range   Cholesterol, Total 288 (H) 100 - 199 mg/dL   Triglycerides 358 (HH) 0 - 149 mg/dL   HDL 41 >60 mg/dL   VLDL Cholesterol Cal 118 (H) 5 - 40 mg/dL   LDL Chol Calc (NIH) 870 (H) 0 - 99 mg/dL   Chol/HDL Ratio 7.0 (H) 0.0 - 4.4 ratio  Microalbumin, Urine Waived   Collection Time: 07/12/24  9:28 AM  Result Value Ref Range   Microalb, Ur Waived 30 (H) 0 - 19 mg/L   Creatinine, Urine Waived 50 10 - 300 mg/dL   Microalb/Creat Ratio 30-300 (H) <30 mg/g  Bayer DCA Hb A1c Waived   Collection Time: 07/12/24  9:28 AM  Result Value Ref Range   HB A1C (BAYER DCA - WAIVED) 10.7 (H) 4.8 - 5.6 %      Assessment & Plan:   Problem List Items Addressed This Visit       Cardiovascular and Mediastinum   Hypertension associated with  diabetes (HCC) - Primary   Chronic.  Improved from prior.  Continue with current medication regimen.  Will check labs at next visit.      Relevant Medications   Semaglutide ,0.25 or 0.5MG /DOS, (OZEMPIC , 0.25 OR 0.5 MG/DOSE,) 2 MG/3ML SOPN  Endocrine   Type 2 diabetes mellitus (HCC)   Chronic.  Improved.  Tolerating medications well.  Will increase Ozempic  to 0.5mg  weekly.  Continue with Jardiance  and Glipizide .  Recommend checking sugars a couple of times weekly.  Follow up in 1 month.  Will check labs at next visit.       Relevant Medications   Semaglutide ,0.25 or 0.5MG /DOS, (OZEMPIC , 0.25 OR 0.5 MG/DOSE,) 2 MG/3ML SOPN     Follow up plan: Return in about 1 month (around 10/13/2024) for HTN, HLD, DM2 FU.      "

## 2024-10-09 ENCOUNTER — Encounter: Payer: Self-pay | Admitting: Oncology

## 2024-10-16 ENCOUNTER — Ambulatory Visit: Admitting: Nurse Practitioner
